# Patient Record
Sex: Male | Born: 1950 | Race: Black or African American | Hispanic: No | Marital: Married | Smoking: Former smoker
Health system: Southern US, Community
[De-identification: ages and names within clinical notes are randomized; demographics above are authoritative.]

## PROBLEM LIST (undated history)

## (undated) DIAGNOSIS — G4730 Sleep apnea, unspecified: Secondary | ICD-10-CM

## (undated) DIAGNOSIS — K219 Gastro-esophageal reflux disease without esophagitis: Secondary | ICD-10-CM

## (undated) DIAGNOSIS — N400 Enlarged prostate without lower urinary tract symptoms: Secondary | ICD-10-CM

## (undated) DIAGNOSIS — Z8679 Personal history of other diseases of the circulatory system: Secondary | ICD-10-CM

## (undated) DIAGNOSIS — I10 Essential (primary) hypertension: Secondary | ICD-10-CM

## (undated) DIAGNOSIS — R531 Weakness: Secondary | ICD-10-CM

## (undated) DIAGNOSIS — Z8659 Personal history of other mental and behavioral disorders: Secondary | ICD-10-CM

## (undated) HISTORY — PX: BRAIN TUMOR EXCISION: SHX577

## (undated) HISTORY — PX: CERVICAL DISCECTOMY: SHX98

## (undated) HISTORY — PX: EYE SURGERY: SHX253

---

## 2014-05-25 DIAGNOSIS — G96198 Other disorders of meninges, not elsewhere classified: Secondary | ICD-10-CM | POA: Insufficient documentation

## 2015-04-18 ENCOUNTER — Emergency Department
Admission: EM | Admit: 2015-04-18 | Discharge: 2015-04-18 | Disposition: A | Discharge status: Home / Self Care | Payer: Medicare Other | Attending: Emergency Medicine | Admitting: Emergency Medicine

## 2015-04-18 ENCOUNTER — Emergency Department: Payer: Medicare Other

## 2015-04-18 ENCOUNTER — Encounter: Payer: Self-pay | Admitting: Emergency Medicine

## 2015-04-18 DIAGNOSIS — K8020 Calculus of gallbladder without cholecystitis without obstruction: Secondary | ICD-10-CM | POA: Insufficient documentation

## 2015-04-18 DIAGNOSIS — I10 Essential (primary) hypertension: Secondary | ICD-10-CM | POA: Diagnosis not present

## 2015-04-18 DIAGNOSIS — Z87891 Personal history of nicotine dependence: Secondary | ICD-10-CM | POA: Diagnosis not present

## 2015-04-18 DIAGNOSIS — R1011 Right upper quadrant pain: Secondary | ICD-10-CM

## 2015-04-18 DIAGNOSIS — K2900 Acute gastritis without bleeding: Secondary | ICD-10-CM | POA: Diagnosis not present

## 2015-04-18 DIAGNOSIS — R0789 Other chest pain: Secondary | ICD-10-CM | POA: Diagnosis present

## 2015-04-18 LAB — CBC
HEMATOCRIT: 41.1 % (ref 40.0–52.0)
Hemoglobin: 12.8 g/dL — ABNORMAL LOW (ref 13.0–18.0)
MCH: 22.9 pg — ABNORMAL LOW (ref 26.0–34.0)
MCHC: 31.1 g/dL — ABNORMAL LOW (ref 32.0–36.0)
MCV: 73.8 fL — AB (ref 80.0–100.0)
PLATELETS: 156 10*3/uL (ref 150–440)
RBC: 5.57 MIL/uL (ref 4.40–5.90)
RDW: 13 % (ref 11.5–14.5)
WBC: 7.4 10*3/uL (ref 3.8–10.6)

## 2015-04-18 LAB — COMPREHENSIVE METABOLIC PANEL
ALK PHOS: 61 U/L (ref 38–126)
ALT: 41 U/L (ref 17–63)
ANION GAP: 7 (ref 5–15)
AST: 76 U/L — ABNORMAL HIGH (ref 15–41)
Albumin: 4.2 g/dL (ref 3.5–5.0)
BILIRUBIN TOTAL: 1.3 mg/dL — AB (ref 0.3–1.2)
BUN: 13 mg/dL (ref 6–20)
CHLORIDE: 103 mmol/L (ref 101–111)
CO2: 27 mmol/L (ref 22–32)
CREATININE: 0.99 mg/dL (ref 0.61–1.24)
Calcium: 8.6 mg/dL — ABNORMAL LOW (ref 8.9–10.3)
GFR calc Af Amer: >60 mL/min (ref >60)
GFR calc non Af Amer: >60 mL/min (ref >60)
Glucose, Bld: 120 mg/dL — ABNORMAL HIGH (ref 65–99)
Potassium: 5.2 mmol/L — ABNORMAL HIGH (ref 3.5–5.1)
SODIUM: 137 mmol/L (ref 135–145)
Total Protein: 7.3 g/dL (ref 6.5–8.1)

## 2015-04-18 LAB — LIPASE, BLOOD: Lipase: 137 U/L — ABNORMAL HIGH (ref 22–51)

## 2015-04-18 LAB — TROPONIN I: Troponin I: <0.03 ng/mL (ref <0.031)

## 2015-04-18 MED ORDER — HYDROCODONE-ACETAMINOPHEN 5-325 MG PO TABS: 1.0000 | ORAL_TABLET | ORAL | Status: DC | PRN

## 2015-04-18 MED ORDER — GI COCKTAIL ~~LOC~~
30.0000 mL | Freq: Once | ORAL | Status: AC
  Administered 2015-04-18: 30 mL via ORAL

## 2015-04-18 MED ORDER — GI COCKTAIL ~~LOC~~
ORAL | Status: AC
  Administered 2015-04-18: 30 mL via ORAL
  Filled 2015-04-18: qty 30

## 2015-04-18 NOTE — ED Notes (Signed)
Pt provided with lunch tray.

## 2015-04-18 NOTE — ED Provider Notes (Signed)
[facility] Emergency Department Provider Note   ____________________________________________  Time seen: 7:30 AM I have reviewed the triage vital signs and the triage nursing note.  HISTORY  Chief Complaint Chest Pain   Historian Patient and his wife  HPI Damon Coffey is a 64 y.o. male whose complaint is low chest/epigastric pain. Started around 5 to 5:30 AM. It felt like gas and he took some Alka-Seltzer. He's had no palpitations and no shortness of breath. No sweating. Mild nausea. No vomiting or diarrhea. No recent illness. No fever.He's not had chest pain like this before. He still has a gall bladder. Pain is mild now, was moderate. No history of cardiac problems.     History reviewed. No pertinent past medical history. reports some hypertension but not on medications for this  There are no active problems to display for this patient.   No past surgical history on file. still has his gallbladder  Current Outpatient Rx  Name  Route  Sig  Dispense  Refill  . HYDROcodone-acetaminophen (NORCO) 5-325 MG per tablet   Oral   Take 1 tablet by mouth every 4 (four) hours as needed for moderate pain.   6 tablet   0     Allergies Contrast media  No family history on file.  Social History History  Substance Use Topics  . Smoking status: Former Research scientist (life sciences)  . Smokeless tobacco: Not on file  . Alcohol Use: 1.2 oz/week    2 Standard drinks or equivalent per week   family history: No coronary artery disease/heart attacks, positive for multiple family members with diabetes  Review of Systems  Constitutional: Negative for fever. Eyes: Negative for visual changes. ENT: Negative for sore throat. Cardiovascular: Negative for palpitations Respiratory: Negative for shortness of breath. Gastrointestinal: Negative for vomiting and diarrhea. Genitourinary: Negative for dysuria. Musculoskeletal: Negative for back pain. Skin: Negative for rash. Neurological:  Negative for headaches, focal weakness or numbness.  ____________________________________________   PHYSICAL EXAM:  VITAL SIGNS: ED Triage Vitals  Enc Vitals Group     BP 04/18/15 0710 118/48 mmHg     Pulse Rate 04/18/15 0710 68     Resp 04/18/15 0710 18     Temp 04/18/15 0710 97.9 F (36.6 C)     Temp Source 04/18/15 0710 Oral     SpO2 04/18/15 0710 98 %     Weight 04/18/15 0710 225 lb (102.059 kg)     Height 04/18/15 0710 5\' 7"  (1.702 m)     Head Cir --      Peak Flow --      Pain Score 04/18/15 0711 6     Pain Loc --      Pain Edu? --      Excl. in [place]? --      Constitutional: Alert and oriented. Well appearing and in no distress. Eyes: Conjunctivae are normal. PERRL. Normal extraocular movements. ENT   Head: Normocephalic and atraumatic.   Nose: No congestion/rhinnorhea.   Mouth/Throat: Mucous membranes are moist.   Neck: No stridor. Cardiovascular: Normal rate, regular rhythm.  No murmurs, rubs, or gallops. Respiratory: Normal respiratory effort without tachypnea nor retractions. Breath sounds are clear and equal bilaterally. No wheezes/rales/rhonchi. Gastrointestinal: Soft with mild tenderness in the epigastrium. No focal tenderness in the right upper quadrant. No lower abdominal tenderness. No guarding no rebound. No distention.  Genitourinary: Musculoskeletal: Nontender with normal range of motion in all extremities. No joint effusions.  No lower extremity tenderness nor edema. Neurologic:  Normal speech and language. No gross focal neurologic deficits are appreciated. Skin:  Skin is warm, dry and intact. No rash noted. Psychiatric: Mood and affect are normal. Speech and behavior are normal. Patient exhibits appropriate insight and judgment.  ____________________________________________   EKG  I, Lisa Roca, MD, the attending physician have personally viewed and interpreted this ECG. 70 bpm. Normal sinus rhythm. Narrow QRS. Normal axis.  Nonspecific T wave. ____________________________________________  LABS (pertinent positives/negatives)  White blood count 7.4, hemoglobin 12.8, potassium 5.2 with hemolysis. Normal BUN and creatinine. AST 76 and bili 1.3 with hemolysis Lipase 137   ____________________________________________  RADIOLOGY Radiologist results reviewed  Chest x-ray: Negative Ultrasound: Gallstones without signs of cholecystitis. __________________________________________  PROCEDURES  Procedure(s) performed: None Critical Care performed: None  ____________________________________________   ED COURSE / ASSESSMENT AND PLAN  Pertinent labs & imaging results that were available during my care of the patient were reviewed by me and considered in my medical decision making (see chart for details).  Patient's symptoms clinically seem more like indigestion/gastritis/abdominal in etiology rather than cardiac. Given the slightly elevated lipase and slightly elevated AST and bili I will go ahead and check a right upper quadrant ultrasound.  I discussed the result of the ultrasound with the patient. Clinically I suspect gastritis/GERD rather than biliary colic, hype ever I did discuss the possibility of biliary colic. He was referred to surgeon if he has ongoing discomfort. I also discussed the atypical chest complaints and recommended follow-up with a cardiologist to consider stress testing.    Patient / Family / Caregiver informed of clinical course, understand medical decision-making process, and agree with plan.    ___________________________________________   FINAL CLINICAL IMPRESSION(S) / ED DIAGNOSES   Final diagnoses:  Right upper quadrant abdominal pain  Acute gastritis without hemorrhage  Gallstones      Lisa Roca, MD 04/20/15 [phone]

## 2015-04-18 NOTE — Discharge Instructions (Signed)
You were evaluated for a lower chest/upper abdominal pain, which I suspect is due to gastritis/GERD. I recommend avoiding fatty, spicy, citrus foods for one week. You may try an acid reducer such as Prilosec 20 mg over-the-counter once a day for one week. He may try over-the-counter Maalox, or Tums, for immediate relief.  Your evaluation was reassuring and I don't think the chest pain was related to a heart blockage, however your EKG shows some minor abnormalities, and I recommend you follow-up with a cardiologist given the atypical nature of the chest discomfort for further evaluation. Call Dr. Tyrell Antonio office for an evaluation appointment this week.  Your ultrasound showed gallstones, but no evidence of obstruction or infection.  You're referred to surgeon for outpatient evaluation to consider a bladder removal. Return to the emergency room for any fever, no worsening abdominal pain, including any vomiting.  Return to the emergency department for any new or worsening chest pain, palpitations, trouble breathing, shortness of breath, abdominal pain, vomiting blood, weakness, dizziness, passing out, or any symptoms concerning to you.  Chest Pain (Nonspecific) It is often hard to give a specific diagnosis for the cause of chest pain. There is always a chance that your pain could be related to something serious, such as a heart attack or a blood clot in the lungs. You need to follow up with your health care provider for further evaluation. CAUSES   Heartburn.  Pneumonia or bronchitis.  Anxiety or stress.  Inflammation around your heart (pericarditis) or lung (pleuritis or pleurisy).  A blood clot in the lung.  A collapsed lung (pneumothorax). It can develop suddenly on its own (spontaneous pneumothorax) or from trauma to the chest.  Shingles infection (herpes zoster virus). The chest wall is composed of bones, muscles, and cartilage. Any of these can be the source of the pain.  The bones can  be bruised by injury.  The muscles or cartilage can be strained by coughing or overwork.  The cartilage can be affected by inflammation and become sore (costochondritis). DIAGNOSIS  Lab tests or other studies may be needed to find the cause of your pain. Your health care provider may have you take a test called an ambulatory electrocardiogram (ECG). An ECG records your heartbeat patterns over a 24-hour period. You may also have other tests, such as:  Transthoracic echocardiogram (TTE). During echocardiography, sound waves are used to evaluate how blood flows through your heart.  Transesophageal echocardiogram (TEE).  Cardiac monitoring. This allows your health care provider to monitor your heart rate and rhythm in real time.  Holter monitor. This is a portable device that records your heartbeat and can help diagnose heart arrhythmias. It allows your health care provider to track your heart activity for several days, if needed.  Stress tests by exercise or by giving medicine that makes the heart beat faster. TREATMENT   Treatment depends on what may be causing your chest pain. Treatment may include:  Acid blockers for heartburn.  Anti-inflammatory medicine.  Pain medicine for inflammatory conditions.  Antibiotics if an infection is present.  You may be advised to change lifestyle habits. This includes stopping smoking and avoiding alcohol, caffeine, and chocolate.  You may be advised to keep your head raised (elevated) when sleeping. This reduces the chance of acid going backward from your stomach into your esophagus. Most of the time, nonspecific chest pain will improve within 2-3 days with rest and mild pain medicine.  HOME CARE INSTRUCTIONS   If antibiotics were prescribed, take  them as directed. Finish them even if you start to feel better.  For the next few days, avoid physical activities that bring on chest pain. Continue physical activities as directed.  Do not use any  tobacco products, including cigarettes, chewing tobacco, or electronic cigarettes.  Avoid drinking alcohol.  Only take medicine as directed by your health care provider.  Follow your health care provider's suggestions for further testing if your chest pain does not go away.  Keep any follow-up appointments you made. If you do not go to an appointment, you could develop lasting (chronic) problems with pain. If there is any problem keeping an appointment, call to reschedule. SEEK MEDICAL CARE IF:   Your chest pain does not go away, even after treatment.  You have a rash with blisters on your chest.  You have a fever. SEEK IMMEDIATE MEDICAL CARE IF:   You have increased chest pain or pain that spreads to your arm, neck, jaw, back, or abdomen.  You have shortness of breath.  You have an increasing cough, or you cough up blood.  You have severe back or abdominal pain.  You feel nauseous or vomit.  You have severe weakness.  You faint.  You have chills. This is an emergency. Do not wait to see if the pain will go away. Get medical help at once. Call your local emergency services (911 in [place] Do not drive yourself to the hospital. MAKE SURE YOU:   Understand these instructions.  Will watch your condition.  Will get help right away if you are not doing well or get worse. Document Released: 08/22/2005 Document Revised: 11/17/2013 Document Reviewed: 06/17/2008 [facility] Patient Information 2015 [place], [place]. This information is not intended to replace advice given to you by your health care provider. Make sure you discuss any questions you have with your health care provider.   Gastritis, Adult Gastritis is soreness and puffiness (inflammation) of the lining of the stomach. If you do not get help, gastritis can cause bleeding and sores (ulcers) in the stomach. HOME CARE   Only take medicine as told by your doctor.  If you were given antibiotic medicines, take them as  told. Finish the medicines even if you start to feel better.  Drink enough fluids to keep your pee (urine) clear or pale yellow.  Avoid foods and drinks that make your problems worse. Foods you may want to avoid include:  Caffeine or alcohol.  Chocolate.  Mint.  Garlic and onions.  Spicy foods.  Citrus fruits, including oranges, lemons, or limes.  Food containing tomatoes, including sauce, chili, salsa, and pizza.  Fried and fatty foods.  Eat small meals throughout the day instead of large meals. GET HELP RIGHT AWAY IF:   You have black or dark red poop (stools).  You throw up (vomit) blood. It may look like coffee grounds.  You cannot keep fluids down.  Your belly (abdominal) pain gets worse.  You have a fever.  You do not feel better after 1 week.  You have any other questions or concerns. MAKE SURE YOU:   Understand these instructions.  Will watch your condition.  Will get help right away if you are not doing well or get worse. Document Released: 04/30/2008 Document Revised: 02/04/2012 Document Reviewed: 12/26/2011 [facility] Patient Information 2015 [place], [place]. This information is not intended to replace advice given to you by your health care provider. Make sure you discuss any questions you have with your health care provider. Gastroesophageal Reflux Disease,  Adult Gastroesophageal reflux disease (GERD) happens when acid from your stomach flows up into the esophagus. When acid comes in contact with the esophagus, the acid causes soreness (inflammation) in the esophagus. Over time, GERD may create small holes (ulcers) in the lining of the esophagus. CAUSES   Increased body weight. This puts pressure on the stomach, making acid rise from the stomach into the esophagus.  Smoking. This increases acid production in the stomach.  Drinking alcohol. This causes decreased pressure in the lower esophageal sphincter (valve or ring of muscle between the esophagus  and stomach), allowing acid from the stomach into the esophagus.  Late evening meals and a full stomach. This increases pressure and acid production in the stomach.  A malformed lower esophageal sphincter. Sometimes, no cause is found. SYMPTOMS   Burning pain in the lower part of the mid-chest behind the breastbone and in the mid-stomach area. This may occur twice a week or more often.  Trouble swallowing.  Sore throat.  Dry cough.  Asthma-like symptoms including chest tightness, shortness of breath, or wheezing. DIAGNOSIS  Your caregiver may be able to diagnose GERD based on your symptoms. In some cases, X-rays and other tests may be done to check for complications or to check the condition of your stomach and esophagus. TREATMENT  Your caregiver may recommend over-the-counter or prescription medicines to help decrease acid production. Ask your caregiver before starting or adding any new medicines.  HOME CARE INSTRUCTIONS   Change the factors that you can control. Ask your caregiver for guidance concerning weight loss, quitting smoking, and alcohol consumption.  Avoid foods and drinks that make your symptoms worse, such as:  Caffeine or alcoholic drinks.  Chocolate.  Peppermint or mint flavorings.  Garlic and onions.  Spicy foods.  Citrus fruits, such as oranges, lemons, or limes.  Tomato-based foods such as sauce, chili, salsa, and pizza.  Fried and fatty foods.  Avoid lying down for the 3 hours prior to your bedtime or prior to taking a nap.  Eat small, frequent meals instead of large meals.  Wear loose-fitting clothing. Do not wear anything tight around your waist that causes pressure on your stomach.  Raise the head of your bed 6 to 8 inches with wood blocks to help you sleep. Extra pillows will not help.  Only take over-the-counter or prescription medicines for pain, discomfort, or fever as directed by your caregiver.  Do not take aspirin, ibuprofen, or  other nonsteroidal anti-inflammatory drugs (NSAIDs). SEEK IMMEDIATE MEDICAL CARE IF:   You have pain in your arms, neck, jaw, teeth, or back.  Your pain increases or changes in intensity or duration.  You develop nausea, vomiting, or sweating (diaphoresis).  You develop shortness of breath, or you faint.  Your vomit is green, yellow, black, or looks like coffee grounds or blood.  Your stool is red, bloody, or black. These symptoms could be signs of other problems, such as heart disease, gastric bleeding, or esophageal bleeding. MAKE SURE YOU:   Understand these instructions.  Will watch your condition.  Will get help right away if you are not doing well or get worse. Document Released: 08/22/2005 Document Revised: 02/04/2012 Document Reviewed: 06/01/2011 [facility] Patient Information 2015 [place], [place]. This information is not intended to replace advice given to you by your health care provider. Make sure you discuss any questions you have with your health care provider.  Cholelithiasis Cholelithiasis (also called gallstones) is a form of gallbladder disease. The gallbladder is a small organ  that helps you digest fats. Symptoms of gallstones are:  Feeling sick to your stomach (nausea).  Throwing up (vomiting).  Belly pain.  Yellowing of the skin (jaundice).  Sudden pain. You may feel the pain for minutes to hours.  Fever.  Pain to the touch. HOME CARE  Only take medicines as told by your doctor.  Eat a low-fat diet until you see your doctor again. Eating fat can result in pain.  Follow up with your doctor as told. Attacks usually happen time after time. Surgery is usually needed for permanent treatment. GET HELP RIGHT AWAY IF:   Your pain gets worse.  Your pain is not helped by medicines.  You have a fever and lasting symptoms for more than 2-3 days.  You have a fever and your symptoms suddenly get worse.  You keep feeling sick to your stomach and throwing  up. MAKE SURE YOU:   Understand these instructions.  Will watch your condition.  Will get help right away if you are not doing well or get worse. Document Released: 04/30/2008 Document Revised: 07/15/2013 Document Reviewed: 05/06/2013 [facility] Patient Information 2015 [place], [place]. This information is not intended to replace advice given to you by your health care provider. Make sure you discuss any questions you have with your health care provider.

## 2015-04-18 NOTE — ED Notes (Signed)
Awoke from sleep, across lower chest

## 2015-04-29 DIAGNOSIS — N62 Hypertrophy of breast: Secondary | ICD-10-CM | POA: Insufficient documentation

## 2015-05-24 ENCOUNTER — Encounter: Payer: Self-pay | Admitting: Emergency Medicine

## 2015-05-24 ENCOUNTER — Other Ambulatory Visit: Payer: Self-pay

## 2015-05-24 ENCOUNTER — Emergency Department
Admission: EM | Admit: 2015-05-24 | Discharge: 2015-05-24 | Discharge status: Against Medical Advice | Payer: Medicare Other | Attending: Emergency Medicine | Admitting: Emergency Medicine

## 2015-05-24 DIAGNOSIS — R109 Unspecified abdominal pain: Secondary | ICD-10-CM | POA: Insufficient documentation

## 2015-05-24 LAB — COMPREHENSIVE METABOLIC PANEL
ALT: 33 U/L (ref 17–63)
AST: 33 U/L (ref 15–41)
Albumin: 4.3 g/dL (ref 3.5–5.0)
Alkaline Phosphatase: 62 U/L (ref 38–126)
Anion gap: 9 (ref 5–15)
BUN: 16 mg/dL (ref 6–20)
CO2: 28 mmol/L (ref 22–32)
Calcium: 9.2 mg/dL (ref 8.9–10.3)
Chloride: 103 mmol/L (ref 101–111)
Creatinine, Ser: 0.97 mg/dL (ref 0.61–1.24)
GFR calc Af Amer: >60 mL/min (ref >60)
GFR calc non Af Amer: >60 mL/min (ref >60)
Glucose, Bld: 119 mg/dL — ABNORMAL HIGH (ref 65–99)
Potassium: 4 mmol/L (ref 3.5–5.1)
Sodium: 140 mmol/L (ref 135–145)
Total Bilirubin: 0.7 mg/dL (ref 0.3–1.2)
Total Protein: 7 g/dL (ref 6.5–8.1)

## 2015-05-24 LAB — CBC WITH DIFFERENTIAL/PLATELET
Basophils Absolute: 0.1 10*3/uL (ref 0–0.1)
Basophils Relative: 1 %
Eosinophils Absolute: 0.2 10*3/uL (ref 0–0.7)
Eosinophils Relative: 3 %
HCT: 39.8 % — ABNORMAL LOW (ref 40.0–52.0)
Hemoglobin: 12.3 g/dL — ABNORMAL LOW (ref 13.0–18.0)
Lymphocytes Relative: 47 %
Lymphs Abs: 2.7 10*3/uL (ref 1.0–3.6)
MCH: 23 pg — ABNORMAL LOW (ref 26.0–34.0)
MCHC: 30.9 g/dL — ABNORMAL LOW (ref 32.0–36.0)
MCV: 74.3 fL — ABNORMAL LOW (ref 80.0–100.0)
Monocytes Absolute: 0.6 10*3/uL (ref 0.2–1.0)
Monocytes Relative: 10 %
Neutro Abs: 2.2 10*3/uL (ref 1.4–6.5)
Neutrophils Relative %: 39 %
Platelets: 147 10*3/uL — ABNORMAL LOW (ref 150–440)
RBC: 5.36 MIL/uL (ref 4.40–5.90)
RDW: 13.1 % (ref 11.5–14.5)
WBC: 5.6 10*3/uL (ref 3.8–10.6)

## 2015-05-24 LAB — URINALYSIS COMPLETE WITH MICROSCOPIC (ARMC ONLY)
Bacteria, UA: NONE SEEN
Bilirubin Urine: NEGATIVE
Glucose, UA: NEGATIVE mg/dL
Hgb urine dipstick: NEGATIVE
Ketones, ur: NEGATIVE mg/dL
Leukocytes, UA: NEGATIVE
Nitrite: NEGATIVE
Protein, ur: NEGATIVE mg/dL
RBC / HPF: NONE SEEN RBC/hpf (ref 0–5)
Specific Gravity, Urine: 1.011 (ref 1.005–1.030)
Squamous Epithelial / LPF: NONE SEEN
WBC, UA: NONE SEEN WBC/hpf (ref 0–5)
pH: 8 (ref 5.0–8.0)

## 2015-05-24 LAB — LIPASE, BLOOD: Lipase: 36 U/L (ref 22–51)

## 2015-05-24 LAB — TROPONIN I: Troponin I: <0.03 ng/mL (ref <0.031)

## 2015-05-24 NOTE — ED Notes (Signed)
Patient ambulatory to triage with steady gait, without difficulty or distress noted; pt reports mid abd pain since 1130pm with no accomp symptoms; st hx of same and dx with gallstones

## 2015-10-17 DIAGNOSIS — G4733 Obstructive sleep apnea (adult) (pediatric): Secondary | ICD-10-CM | POA: Insufficient documentation

## 2016-03-21 DIAGNOSIS — M5417 Radiculopathy, lumbosacral region: Secondary | ICD-10-CM | POA: Insufficient documentation

## 2017-05-28 ENCOUNTER — Other Ambulatory Visit: Payer: Self-pay | Admitting: Neurology

## 2017-05-28 DIAGNOSIS — D4819 Other specified neoplasm of uncertain behavior of connective and other soft tissue: Secondary | ICD-10-CM

## 2017-05-28 DIAGNOSIS — R200 Anesthesia of skin: Secondary | ICD-10-CM

## 2017-05-28 DIAGNOSIS — D481 Neoplasm of uncertain behavior of connective and other soft tissue: Secondary | ICD-10-CM

## 2017-05-31 ENCOUNTER — Ambulatory Visit
Admission: RE | Admit: 2017-05-31 | Discharge: 2017-05-31 | Disposition: A | Discharge status: Home / Self Care | Payer: Medicare Other | Source: Ambulatory Visit | Attending: Neurology | Admitting: Neurology

## 2017-05-31 DIAGNOSIS — Z9889 Other specified postprocedural states: Secondary | ICD-10-CM | POA: Insufficient documentation

## 2017-05-31 DIAGNOSIS — D481 Neoplasm of uncertain behavior of connective and other soft tissue: Secondary | ICD-10-CM | POA: Diagnosis present

## 2017-05-31 DIAGNOSIS — G5621 Lesion of ulnar nerve, right upper limb: Secondary | ICD-10-CM | POA: Insufficient documentation

## 2017-05-31 DIAGNOSIS — R200 Anesthesia of skin: Secondary | ICD-10-CM

## 2017-05-31 DIAGNOSIS — D4819 Other specified neoplasm of uncertain behavior of connective and other soft tissue: Secondary | ICD-10-CM

## 2017-05-31 DIAGNOSIS — M6281 Muscle weakness (generalized): Secondary | ICD-10-CM | POA: Diagnosis present

## 2017-05-31 MED ORDER — GADOBENATE DIMEGLUMINE 529 MG/ML IV SOLN
20.0000 mL | Freq: Once | INTRAVENOUS | Status: AC | PRN
  Administered 2017-05-31: 20 mL via INTRAVENOUS

## 2017-10-09 ENCOUNTER — Other Ambulatory Visit: Payer: Self-pay

## 2017-10-09 ENCOUNTER — Encounter: Payer: Self-pay | Admitting: Emergency Medicine

## 2017-10-09 ENCOUNTER — Emergency Department
Admission: EM | Admit: 2017-10-09 | Discharge: 2017-10-09 | Disposition: A | Discharge status: Home / Self Care | Payer: Medicare Other | Attending: Emergency Medicine | Admitting: Emergency Medicine

## 2017-10-09 DIAGNOSIS — Z87891 Personal history of nicotine dependence: Secondary | ICD-10-CM | POA: Diagnosis not present

## 2017-10-09 DIAGNOSIS — K2970 Gastritis, unspecified, without bleeding: Secondary | ICD-10-CM | POA: Insufficient documentation

## 2017-10-09 DIAGNOSIS — R1013 Epigastric pain: Secondary | ICD-10-CM | POA: Diagnosis present

## 2017-10-09 LAB — CBC
HEMATOCRIT: 42.3 % (ref 40.0–52.0)
Hemoglobin: 13.2 g/dL (ref 13.0–18.0)
MCH: 23.1 pg — ABNORMAL LOW (ref 26.0–34.0)
MCHC: 31.2 g/dL — AB (ref 32.0–36.0)
MCV: 74 fL — AB (ref 80.0–100.0)
PLATELETS: 168 10*3/uL (ref 150–440)
RBC: 5.71 MIL/uL (ref 4.40–5.90)
RDW: 12.9 % (ref 11.5–14.5)
WBC: 11.6 10*3/uL — AB (ref 3.8–10.6)

## 2017-10-09 LAB — URINALYSIS, COMPLETE (UACMP) WITH MICROSCOPIC
Bilirubin Urine: NEGATIVE
Glucose, UA: NEGATIVE mg/dL
Hgb urine dipstick: NEGATIVE
Ketones, ur: NEGATIVE mg/dL
Leukocytes, UA: NEGATIVE
Nitrite: NEGATIVE
PH: 6 (ref 5.0–8.0)
Protein, ur: NEGATIVE mg/dL
SPECIFIC GRAVITY, URINE: 1.002 — AB (ref 1.005–1.030)
Squamous Epithelial / LPF: NONE SEEN

## 2017-10-09 LAB — BASIC METABOLIC PANEL
Anion gap: 8 (ref 5–15)
BUN: 14 mg/dL (ref 6–20)
CHLORIDE: 106 mmol/L (ref 101–111)
CO2: 25 mmol/L (ref 22–32)
Calcium: 9.7 mg/dL (ref 8.9–10.3)
Creatinine, Ser: 0.94 mg/dL (ref 0.61–1.24)
GFR calc Af Amer: >60 mL/min (ref >60)
GLUCOSE: 104 mg/dL — AB (ref 65–99)
POTASSIUM: 3.9 mmol/L (ref 3.5–5.1)
SODIUM: 139 mmol/L (ref 135–145)

## 2017-10-09 LAB — TROPONIN I: Troponin I: <0.03 ng/mL (ref <0.03)

## 2017-10-09 MED ORDER — FAMOTIDINE 40 MG PO TABS: 40.0000 mg | ORAL_TABLET | Freq: Every evening | ORAL | 1 refills | Status: AC

## 2017-10-09 MED ORDER — SUCRALFATE 1 G PO TABS: 1.0000 g | ORAL_TABLET | Freq: Four times a day (QID) | ORAL | 0 refills | Status: AC

## 2017-10-09 NOTE — ED Notes (Signed)
ED Provider at bedside. 

## 2017-10-09 NOTE — ED Notes (Signed)
Pt ambulatory with cane upon discharge. Pt refused wheel chair. Pt and wife verbalized understanding of discharge instruction, prescription and follow-up care. VSS. A&O x4. Skin warm and dry.

## 2017-10-09 NOTE — ED Triage Notes (Signed)
Patient with complaint of intermittent central upper abdominal pain with black stools times three weeks.

## 2017-10-09 NOTE — ED Provider Notes (Signed)
[facility] Emergency Department Provider Note    ____________________________________________   I have reviewed the triage vital signs and the nursing notes.   HISTORY  Chief Complaint Abdominal Pain and Melena   History limited by: Not Limited   HPI Damon Coffey is a 66 y.o. male who presents to the emergency department today because of concern for epigastric pain and dark stools  LOCATION:epigastrum DURATION:3 weeks TIMING: intermittent CONTEXT: patient states that for the past three weeks he has had epigastric pain.  MODIFYING FACTORS: worse with eating ASSOCIATED SYMPTOMS: positive for dark stool. denies any vomiting. Denies fever  Per medical record review patient has a history of previous ED visit with epigastric pain, diagnosed with gastritis.   History reviewed. No pertinent past medical history.  There are no active problems to display for this patient.   Past Surgical History:  Procedure Laterality Date  . BRAIN TUMOR EXCISION    . CERVICAL DISCECTOMY      Prior to Admission medications   Medication Sig Start Date End Date Taking? Authorizing Provider  HYDROcodone-acetaminophen (NORCO) 5-325 MG per tablet Take 1 tablet by mouth every 4 (four) hours as needed for moderate pain. 04/18/15   Lisa Roca, MD    Allergies Contrast media [iodinated diagnostic agents]  No family history on file.  Social History Social History   Tobacco Use  . Smoking status: Former Research scientist (life sciences)  . Smokeless tobacco: Never Used  Substance Use Topics  . Alcohol use: Yes    Alcohol/week: 1.2 oz    Types: 2 Standard drinks or equivalent per week  . Drug use: Not on file    Review of Systems Constitutional: No fever/chills Eyes: No visual changes. ENT: No sore throat. Cardiovascular: Denies chest pain. Respiratory: Denies shortness of breath. Gastrointestinal: Positive for epigastric pain Genitourinary: Negative for dysuria. Musculoskeletal:  Negative for back pain. Skin: Negative for rash. Neurological: Negative for headaches, focal weakness or numbness.  ____________________________________________   PHYSICAL EXAM:  VITAL SIGNS: ED Triage Vitals [10/09/17 1917]  Enc Vitals Group     BP (!) 154/81     Pulse Rate 62     Resp 18     Temp 98.7 F (37.1 C)     Temp Source Oral     SpO2 98 %     Weight 210 lb (95.3 kg)     Height 5\' 7"  (1.702 m)     Head Circumference      Peak Flow      Pain Score 6   Constitutional: Alert and oriented. Well appearing and in no distress. Eyes: Conjunctivae are normal.  ENT   Head: Normocephalic and atraumatic.   Nose: No congestion/rhinnorhea.   Mouth/Throat: Mucous membranes are moist.   Neck: No stridor. Hematological/Lymphatic/Immunilogical: No cervical lymphadenopathy. Cardiovascular: Normal rate, regular rhythm.  No murmurs, rubs, or gallops.  Respiratory: Normal respiratory effort without tachypnea nor retractions. Breath sounds are clear and equal bilaterally. No wheezes/rales/rhonchi. Gastrointestinal: Soft and non tender. No rebound. No guarding.  Genitourinary: Deferred Musculoskeletal: Normal range of motion in all extremities. No lower extremity edema. Neurologic:  Normal speech and language. No gross focal neurologic deficits are appreciated.  Skin:  Skin is warm, dry and intact. No rash noted. Psychiatric: Mood and affect are normal. Speech and behavior are normal. Patient exhibits appropriate insight and judgment.  ____________________________________________    LABS (pertinent positives/negatives)  Trop <0.03 CBC wbc 11.6, hgb 13.2 BMP glu 104 otherwise wnl UA not consistent with infection,  no blood  ____________________________________________   EKG  I, Nance Pear, attending physician, personally viewed and interpreted this EKG  EKG Time: 1926 Rate: 65 Rhythm: sinus rhythm 1st degree av block Axis: normal Intervals: qtc  395 QRS: narrow ST changes: no st elevation Impression: abnormal ekg   ____________________________________________    RADIOLOGY  None  ____________________________________________   PROCEDURES  Procedures  ____________________________________________   INITIAL IMPRESSION / ASSESSMENT AND PLAN / ED COURSE  Pertinent labs & imaging results that were available during my care of the patient were reviewed by me and considered in my medical decision making (see chart for details).  Differential diagnosis includes, but is not limited to, biliary disease (biliary colic, acute cholecystitis, cholangitis, choledocholithiasis, etc), intrathoracic causes for epigastric abdominal pain including ACS, gastritis, duodenitis, pancreatitis, small bowel or large bowel obstruction, abdominal aortic aneurysm, hernia, and gastritis.  Clinical story and work up most consistent with gastritis. Given length of symptoms and lack of anemia or abnormal vital signs do not think significant bleed at this time. Will start on antacids and sucralfate. Will give diet information. Discussed plan with patient and return precautions.  ____________________________________________   FINAL CLINICAL IMPRESSION(S) / ED DIAGNOSES  Final diagnoses:  Gastritis, presence of bleeding unspecified, unspecified chronicity, unspecified gastritis type     Note: This dictation was prepared with Dragon dictation. Any transcriptional errors that result from this process are unintentional     Nance Pear, MD 10/09/17 2344

## 2017-10-09 NOTE — Discharge Instructions (Signed)
Please seek medical attention for any high fevers, chest pain, shortness of breath, change in behavior, persistent vomiting, bloody stool or any other new or concerning symptoms.  

## 2017-10-22 ENCOUNTER — Ambulatory Visit: Payer: Medicare Other | Admitting: Gastroenterology

## 2018-01-29 ENCOUNTER — Other Ambulatory Visit: Payer: Self-pay | Admitting: Neurology

## 2018-01-29 DIAGNOSIS — R42 Dizziness and giddiness: Secondary | ICD-10-CM

## 2018-02-07 ENCOUNTER — Ambulatory Visit
Admission: RE | Admit: 2018-02-07 | Discharge: 2018-02-07 | Disposition: A | Discharge status: Home / Self Care | Payer: Medicare Other | Source: Ambulatory Visit | Attending: Neurology | Admitting: Neurology

## 2018-02-07 DIAGNOSIS — Z9889 Other specified postprocedural states: Secondary | ICD-10-CM | POA: Diagnosis not present

## 2018-02-07 DIAGNOSIS — G3189 Other specified degenerative diseases of nervous system: Secondary | ICD-10-CM | POA: Insufficient documentation

## 2018-02-07 DIAGNOSIS — R42 Dizziness and giddiness: Secondary | ICD-10-CM

## 2018-02-12 DIAGNOSIS — G5601 Carpal tunnel syndrome, right upper limb: Secondary | ICD-10-CM | POA: Insufficient documentation

## 2018-02-12 DIAGNOSIS — G5621 Lesion of ulnar nerve, right upper limb: Secondary | ICD-10-CM | POA: Insufficient documentation

## 2018-06-17 ENCOUNTER — Other Ambulatory Visit: Payer: Self-pay

## 2018-06-17 ENCOUNTER — Ambulatory Visit: Payer: Medicare Other | Attending: Sports Medicine | Admitting: Occupational Therapy

## 2018-06-17 DIAGNOSIS — G5621 Lesion of ulnar nerve, right upper limb: Secondary | ICD-10-CM | POA: Diagnosis present

## 2018-06-17 NOTE — Therapy (Signed)
[facility] [address] [place], [address] Phone: [phone]   Fax:  [phone]  Occupational Therapy Evaluation  Patient Details  Name: Damon Coffey MRN: [id number] Date of Birth: 1950/12/02 Referring Provider: Candelaria Stagers   Encounter Date: 06/17/2018  OT End of Session - 06/17/18 1608    Visit Number  1    Number of Visits  6    Date for OT Re-Evaluation  07/29/18    OT Start Time  1340    OT Stop Time  1443    OT Time Calculation (min)  63 min    Activity Tolerance  Patient tolerated treatment well    Behavior During Therapy  [facility] for tasks assessed/performed       No past medical history on file.  Past Surgical History:  Procedure Laterality Date  . BRAIN TUMOR EXCISION    . CERVICAL DISCECTOMY      There were no vitals filed for this visit.  Subjective Assessment - 06/17/18 1600    Subjective   I had for about year the numbness, sensation changes in my arm into my pinkie, ring and some middle finger - as well as elbow pain - but then also at my R upper back on my shoulder blade -  I cannot get comfortable at night time - I cannot use my hand to  hold or pick up things because of my neck  and forearm surgery in 2014 - so I pick up with my palms and forearms     Patient Stated Goals  Just want the pain and sensation issues better - so I can sleep and use my R arm without pain or discomfort     Currently in Pain?  No/denies        [facility] OT Assessment - 06/17/18 0001      Assessment   Medical Diagnosis  R ulnar neuropathy , R rhomboid pain    Referring Provider  Kubinski    Onset Date/Surgical Date  04/26/17    Hand Dominance  Right      Home  Environment   Lives With  Spouse      Prior Function   Vocation  On disability    Leisure  Jehova withness, go out and  visit people , watch tv ,       Strength   Right Hand Grip (lbs)  35    Left Hand Grip (lbs)  80          Pt to do  contrast on R elbow   2 x day  And Ulnar N glide   5 reps   2 x day  Elbow pad to wear at night time anterior elbow  And daytime over cubital tunnel to decrease compression   Recommend Xray to cervical  And PT for R upper back , scapula , weakness - trigger points to upper traps and periscapular muscle              OT Education - 06/17/18 1608    Education Details  Findings of OT eval ,POC , PT referral and Homeprogram     Person(s) Educated  Patient    Methods  Explanation;Demonstration;Handout    Comprehension  Verbalized understanding;Returned demonstration          OT Long Term Goals - 06/17/18 1618      OT LONG TERM GOAL #1   Title  Pt to be ind in HEP for  decrease syptoms at cubital tunnel - including tenderness and Tinel     Baseline  positive Tinel and tenderness at Cubital tunnel     Time  6    Status  New    Target Date  07/29/18            Plan - 06/17/18 1609    Clinical Impression Statement  Pt present at OT eval with extended history of complex surgical intervention of cervical spine , ulnar and medial nerve - pt test positive for Tinel at R cubital tunnel , and tenderness - pt has ulnar  nerve deformity to hand since cervical surgery in 2014 - pt now with new onset of parasthesis  ulnar nerve since summer 2018 - but pt also show R sided cervical radicular symptoms  with palpation of trigger points in upper traps and periscapular muscles  with muscle weakness- pt was provided with HEP for neuropathy at elbow - but would recommend referral to PT for R cervical , scapula and shoulder - but prior to that evaluation  would ask for Cervical xray please     Occupational performance deficits (Please refer to evaluation for details):  ADL's;Rest and Sleep;Play;Leisure;IADL's    Rehab Potential  Fair    Current Impairments/barriers affecting progress:  extended surgical history to cervical and ulnar - medial nerve on R arm     OT Frequency  1x / week    OT  Duration  6 weeks    OT Treatment/Interventions  Self-care/ADL training;Therapeutic exercise;Patient/family education;Splinting;Ultrasound;Contrast Bath;Iontophoresis;Manual Therapy    Plan  referral to PT for cervical radicular symptoms and triggerpoints scapula - and follow up on pt after doing HEP for week for cubital tunnel     Clinical Decision Making  Several treatment options, min-mod task modification necessary    OT Home Exercise Plan  see pt instruction     Consulted and Agree with Plan of Care  Patient       Patient will benefit from skilled therapeutic intervention in order to improve the following deficits and impairments:  Pain, Impaired sensation, Decreased strength, Increased muscle spasms, Impaired UE functional use, Decreased range of motion  Visit Diagnosis: Ulnar neuropathy of right upper extremity - Plan: Ot plan of care cert/re-cert    Problem List There are no active problems to display for this patient.   Rosalyn Gess OTR/L,CLT 06/17/2018, 4:23 PM  [facility] [address] [place], [address] Phone: [phone]   Fax:  [phone]  Name: Damon Coffey MRN: [id number] Date of Birth: 02/24/1951

## 2018-06-17 NOTE — Patient Instructions (Signed)
Pt to do contrast on R elbow   2 x day  And Ulnar N glide   5 reps   2 x day  Elbow pad to wear at night time anterior elbow  And daytime over cubital tunnel to decrease compression   Recommend Xray to cervical  And PT for R upper back , scapula , weakness - trigger points to upper traps and periscapular muscle

## 2018-06-27 ENCOUNTER — Ambulatory Visit: Payer: Medicare Other | Admitting: Occupational Therapy

## 2018-07-03 ENCOUNTER — Ambulatory Visit: Payer: Medicare Other | Admitting: Occupational Therapy

## 2018-07-03 ENCOUNTER — Ambulatory Visit: Payer: Medicare Other | Admitting: Physical Therapy

## 2018-07-07 ENCOUNTER — Ambulatory Visit: Payer: Medicare Other | Admitting: Occupational Therapy

## 2018-07-07 ENCOUNTER — Ambulatory Visit: Payer: Medicare Other | Admitting: Physical Therapy

## 2018-07-10 ENCOUNTER — Ambulatory Visit: Payer: Medicare Other | Admitting: Physical Therapy

## 2018-07-10 ENCOUNTER — Ambulatory Visit: Payer: Medicare Other | Admitting: Occupational Therapy

## 2018-07-14 ENCOUNTER — Ambulatory Visit: Payer: Medicare Other | Admitting: Physical Therapy

## 2018-07-14 ENCOUNTER — Ambulatory Visit: Payer: Medicare Other | Admitting: Occupational Therapy

## 2019-09-03 ENCOUNTER — Emergency Department: Payer: Medicare Other

## 2019-09-03 ENCOUNTER — Emergency Department
Admission: EM | Admit: 2019-09-03 | Discharge: 2019-09-03 | Disposition: A | Discharge status: Home / Self Care | Payer: Medicare Other | Attending: Emergency Medicine | Admitting: Emergency Medicine

## 2019-09-03 ENCOUNTER — Other Ambulatory Visit: Payer: Self-pay

## 2019-09-03 DIAGNOSIS — Z87891 Personal history of nicotine dependence: Secondary | ICD-10-CM | POA: Insufficient documentation

## 2019-09-03 DIAGNOSIS — I10 Essential (primary) hypertension: Secondary | ICD-10-CM | POA: Diagnosis not present

## 2019-09-03 DIAGNOSIS — H539 Unspecified visual disturbance: Secondary | ICD-10-CM

## 2019-09-03 DIAGNOSIS — H538 Other visual disturbances: Secondary | ICD-10-CM | POA: Insufficient documentation

## 2019-09-03 HISTORY — DX: Essential (primary) hypertension: I10

## 2019-09-03 HISTORY — DX: Benign prostatic hyperplasia without lower urinary tract symptoms: N40.0

## 2019-09-03 LAB — CBC
HCT: 38.6 % — ABNORMAL LOW (ref 39.0–52.0)
Hemoglobin: 11.7 g/dL — ABNORMAL LOW (ref 13.0–17.0)
MCH: 22.9 pg — ABNORMAL LOW (ref 26.0–34.0)
MCHC: 30.3 g/dL (ref 30.0–36.0)
MCV: 75.4 fL — ABNORMAL LOW (ref 80.0–100.0)
Platelets: 190 10*3/uL (ref 150–400)
RBC: 5.12 MIL/uL (ref 4.22–5.81)
RDW: 12.5 % (ref 11.5–15.5)
WBC: 7 10*3/uL (ref 4.0–10.5)
nRBC: 0 % (ref 0.0–0.2)

## 2019-09-03 LAB — BASIC METABOLIC PANEL
Anion gap: 6 (ref 5–15)
BUN: 14 mg/dL (ref 8–23)
CO2: 28 mmol/L (ref 22–32)
Calcium: 9 mg/dL (ref 8.9–10.3)
Chloride: 105 mmol/L (ref 98–111)
Creatinine, Ser: 0.9 mg/dL (ref 0.61–1.24)
GFR calc Af Amer: >60 mL/min (ref >60)
GFR calc non Af Amer: >60 mL/min (ref >60)
Glucose, Bld: 100 mg/dL — ABNORMAL HIGH (ref 70–99)
Potassium: 4.1 mmol/L (ref 3.5–5.1)
Sodium: 139 mmol/L (ref 135–145)

## 2019-09-03 LAB — GLUCOSE, CAPILLARY: Glucose-Capillary: 94 mg/dL (ref 70–99)

## 2019-09-03 NOTE — ED Triage Notes (Signed)
Brought from [facility] for blurry vision right eye. He woke up with it this am.  History of brain tumor.

## 2019-09-03 NOTE — ED Triage Notes (Signed)
Pt reports onset of blurry vision upon awakening this morning. Unable to specify a last known well time as he states that he only noticed it was blurry when he went to read something at breakfast. Pt states he is able to see, but right eye is fuzzy. Denies headache, slurred speech, confusion, weakness, or balance problems. Pt wears glasses at baseline. Pt alert and oriented X4, cooperative, RR even and unlabored, color WNL. Pt in NAD.

## 2019-09-03 NOTE — ED Notes (Signed)
Pt asking for snack. Given graham crackers and peanut butter and ice water. Ok per Dr. Jimmye Norman.

## 2019-09-03 NOTE — ED Notes (Addendum)
Pt reports blurred vision in his right eye that started this morning. Pt states he went to [facility] clinic and was sent to ER. Pt wears glasses and is wearing glasses at this time.  Denies headache, or hx HTN. NAD at this time. Speech clear at this time, no weakness to extremities, ambulatory.

## 2019-09-03 NOTE — ED Provider Notes (Signed)
[facility] Emergency Department Provider Note       Time seen: ----------------------------------------- 2:38 PM on 09/03/2019 -----------------------------------------   I have reviewed the triage vital signs and the nursing notes.  HISTORY   Chief Complaint Blurred Vision   HPI Damon Coffey is a 68 y.o. male with a history of enlarged prostate, hypertension, brain tumor excision, cervical discectomy who presents to the ED for blurry vision noted upon awakening this morning.  Patient was unable to specify last known well time.  Patient states he is able to see but his right eye is fuzzy.  He denies headache, slurred speech, confusion, weakness or balance problems.  He wears glasses at baseline.  Past Medical History:  Diagnosis Date  . Enlarged prostate   . Hypertension     There are no active problems to display for this patient.   Past Surgical History:  Procedure Laterality Date  . BRAIN TUMOR EXCISION    . CERVICAL DISCECTOMY      Allergies Contrast media [iodinated diagnostic agents]  Social History Social History   Tobacco Use  . Smoking status: Former Research scientist (life sciences)  . Smokeless tobacco: Never Used  Substance Use Topics  . Alcohol use: Yes    Alcohol/week: 2.0 standard drinks    Types: 2 Standard drinks or equivalent per week  . Drug use: Not on file   Review of Systems Constitutional: Negative for fever. HEENT: Positive for vision changes Cardiovascular: Negative for chest pain. Respiratory: Negative for shortness of breath. Gastrointestinal: Negative for abdominal pain, vomiting and diarrhea. Musculoskeletal: Negative for back pain. Skin: Negative for rash. Neurological: Negative for headaches, focal weakness or numbness.  All systems negative/normal/unremarkable except as stated in the HPI  ____________________________________________   PHYSICAL EXAM:  VITAL SIGNS: ED Triage Vitals  Enc Vitals Group     BP 09/03/19  1209 123/70     Pulse Rate 09/03/19 1209 67     Resp 09/03/19 1209 16     Temp 09/03/19 1213 98 F (36.7 C)     Temp Source 09/03/19 1209 Oral     SpO2 09/03/19 1209 97 %     Weight 09/03/19 1213 210 lb (95.3 kg)     Height 09/03/19 1213 5\' 7"  (1.702 m)     Head Circumference --      Peak Flow --      Pain Score 09/03/19 1213 0     Pain Loc --      Pain Edu? --      Excl. in [place]? --    Constitutional: Alert and oriented. Well appearing and in no distress. Eyes: Conjunctivae are normal. Normal extraocular movements.  No erythema is noted, normal pupillary function ENT      Head: Normocephalic and atraumatic.  Previous occipital craniotomy scars noted      Nose: No congestion/rhinnorhea.      Mouth/Throat: Mucous membranes are moist.      Neck: No stridor. Cardiovascular: Normal rate, regular rhythm. No murmurs, rubs, or gallops. Respiratory: Normal respiratory effort without tachypnea nor retractions. Breath sounds are clear and equal bilaterally. No wheezes/rales/rhonchi. Musculoskeletal: Nontender with normal range of motion in extremities. No lower extremity tenderness nor edema. Neurologic:  Normal speech and language. No gross focal neurologic deficits are appreciated.  Skin:  Skin is warm, dry and intact. No rash noted. Psychiatric: Mood and affect are normal. Speech and behavior are normal.  ____________________________________________  ED COURSE:  As part of my medical decision making, I reviewed  the following data within the [place] History obtained from family if available, nursing notes, old chart and ekg, as well as notes from prior ED visits. Patient presented for vision changes, we will assess with labs and imaging as indicated at this time.   Procedures  Damon Coffey was evaluated in Emergency Department on 09/03/2019 for the symptoms described in the history of present illness. He was evaluated in the context of the global COVID-19 pandemic,  which necessitated consideration that the patient might be at risk for infection with the SARS-CoV-2 virus that causes COVID-19. Institutional protocols and algorithms that pertain to the evaluation of patients at risk for COVID-19 are in a state of rapid change based on information released by regulatory bodies including the CDC and federal and state organizations. These policies and algorithms were followed during the patient's care in the ED.  ____________________________________________   LABS (pertinent positives/negatives)  Labs Reviewed  CBC - Abnormal; Notable for the following components:      Result Value   Hemoglobin 11.7 (*)    HCT 38.6 (*)    MCV 75.4 (*)    MCH 22.9 (*)    All other components within normal limits  BASIC METABOLIC PANEL - Abnormal; Notable for the following components:   Glucose, Bld 100 (*)    All other components within normal limits  GLUCOSE, CAPILLARY  CBG MONITORING, ED    RADIOLOGY Images were viewed by me  CT head  IMPRESSION:  1. No acute intracranial pathology.   2. 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  ____________________________________________   DIFFERENTIAL DIAGNOSIS   Retinal detachment, cataract formation, brain tumor, hyperglycemia  FINAL ASSESSMENT AND PLAN  Vision changes   Plan: The patient had presented for vision changes in the right eye. Patient's labs were reassuring. Patient's imaging did not reveal any acute process.  I have discussed with ophthalmology who will see him tomorrow morning for reevaluation.   Laurence Aly, MD    Note: This note was generated in part or whole with voice recognition software. Voice recognition is usually quite accurate but there are transcription errors that can and very often do occur. I apologize for any typographical errors that were not detected and corrected.     Earleen Newport, MD 09/03/19 [phone]

## 2019-09-03 NOTE — ED Notes (Signed)
Patient in wheelchair in lobby in nad.  Updated on wait time.

## 2019-10-12 ENCOUNTER — Other Ambulatory Visit: Payer: Self-pay

## 2019-10-12 DIAGNOSIS — Z20822 Contact with and (suspected) exposure to covid-19: Secondary | ICD-10-CM

## 2019-10-14 LAB — NOVEL CORONAVIRUS, NAA: SARS-CoV-2, NAA: NOT DETECTED

## 2020-03-23 DIAGNOSIS — I10 Essential (primary) hypertension: Secondary | ICD-10-CM | POA: Insufficient documentation

## 2020-04-26 IMAGING — CT CT HEAD W/O CM
3 series · 15 of 47 positions shown, 18 images · non-contrast
Comparison: 02/07/2018, MR brain, 05/31/2017

CLINICAL DATA: Stroke suspected, vision changes, history of brain
tumor

EXAM:
CT HEAD WITHOUT CONTRAST
TECHNIQUE: Contiguous axial images were obtained from the base of the skull
through the vertex without intravenous contrast.

[Series 2: head wo · axial · 0.43mm/px · z∈[+319,+444]mm · 9 of 30 slices shown, 12 images]
[im 3/30  brain]
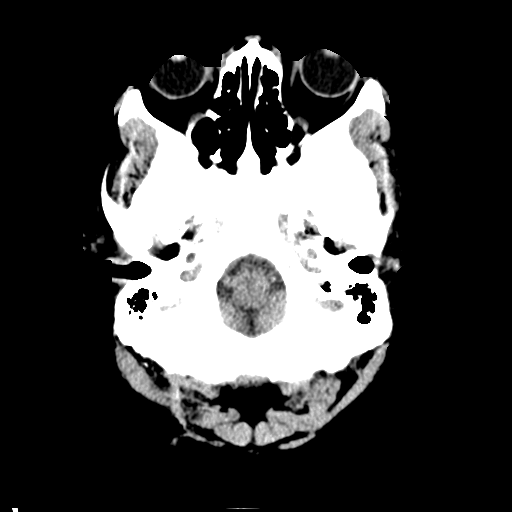
[im 3/30  bone]
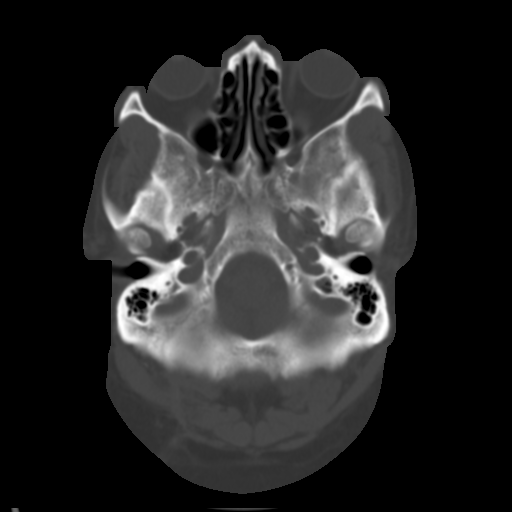
[im 6/30  brain]
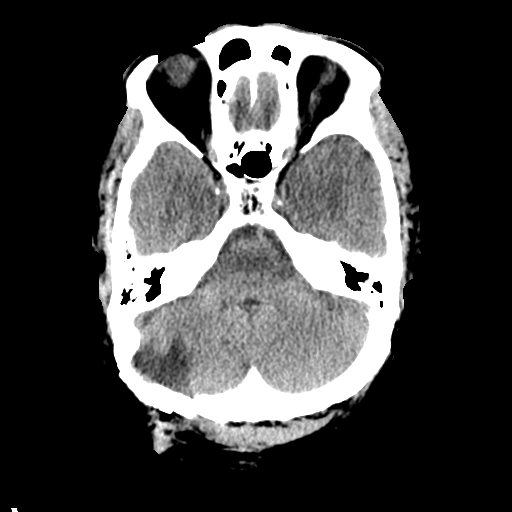
[im 9/30  brain]
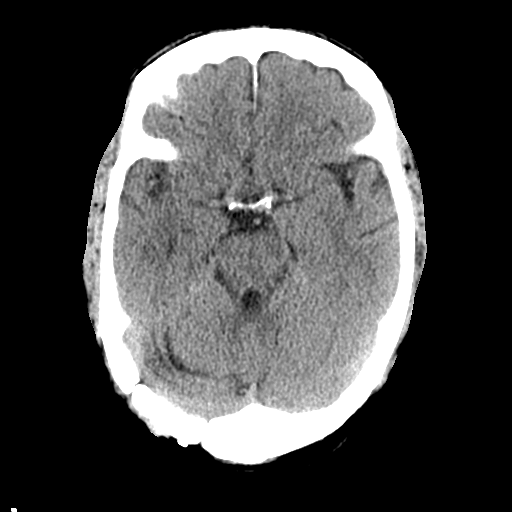
[im 12/30  brain]
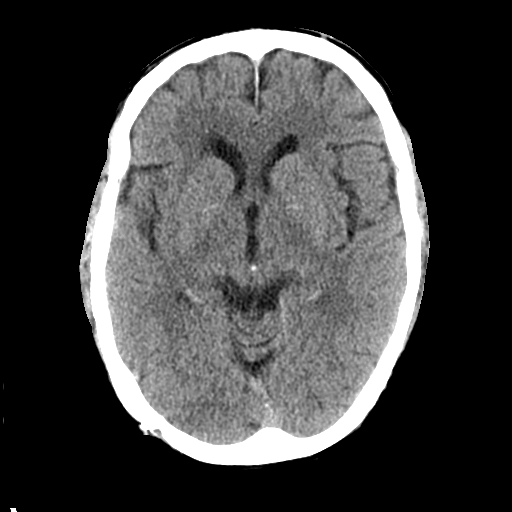
[im 16/30  brain]
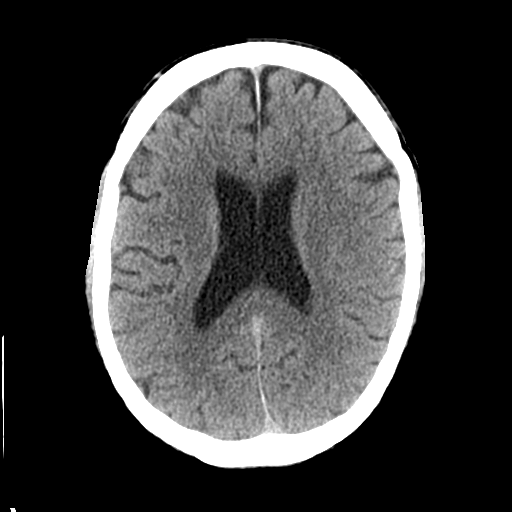
[im 16/30  bone]
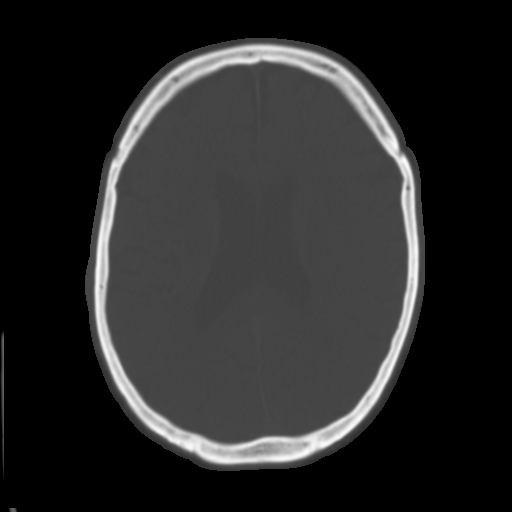
[im 19/30  brain]
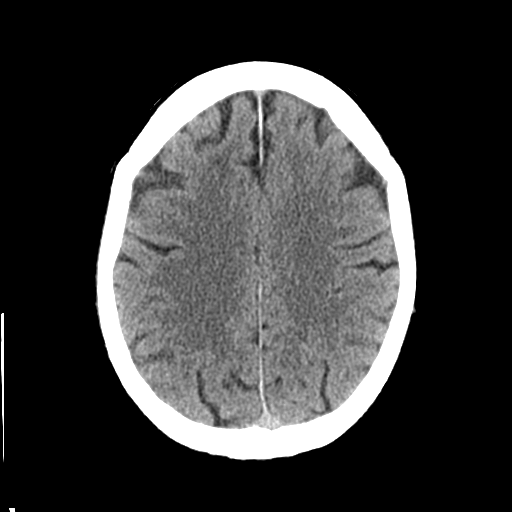
[im 22/30  brain]
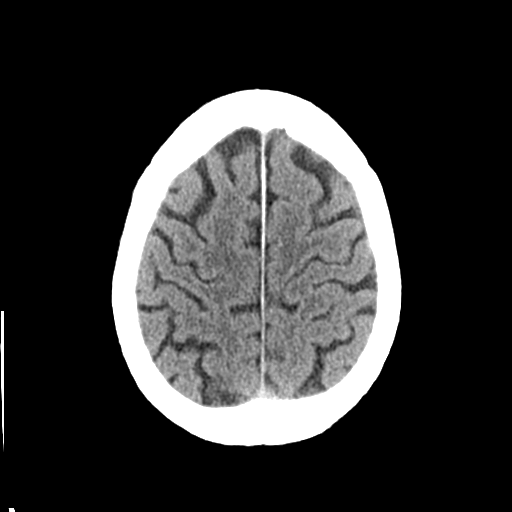
[im 25/30  brain]
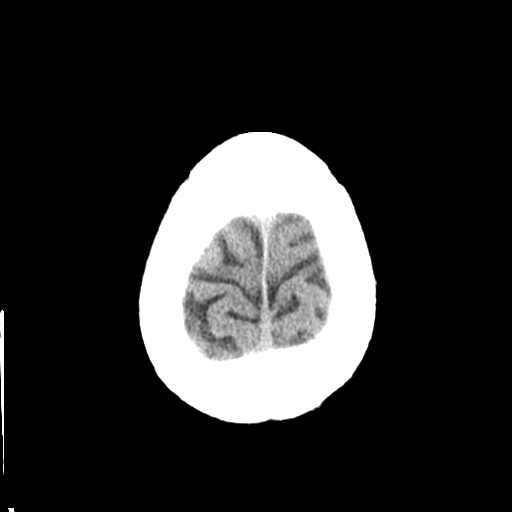
[im 28/30  brain]
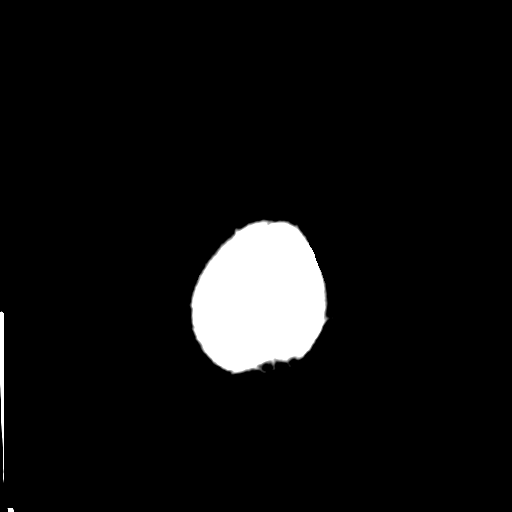
[im 28/30  bone]
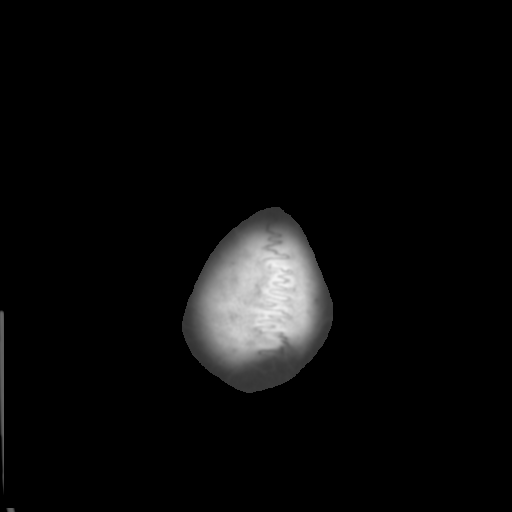

[Series 4: coronal soft tissue · coronal · 0.31mm/px · 3 of 66 slices shown]
[im 22/66  brain]
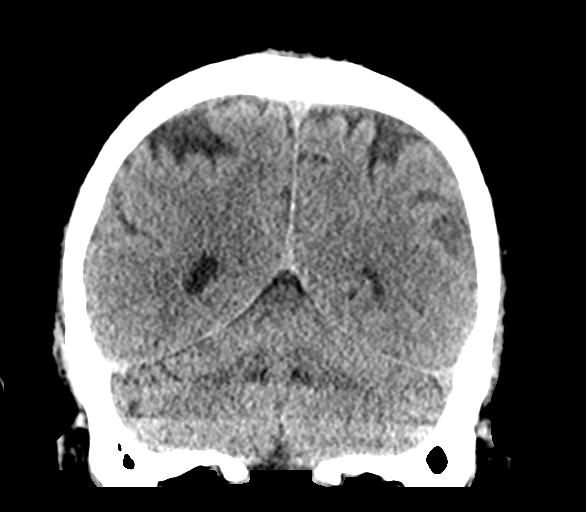
[im 29/66  brain]
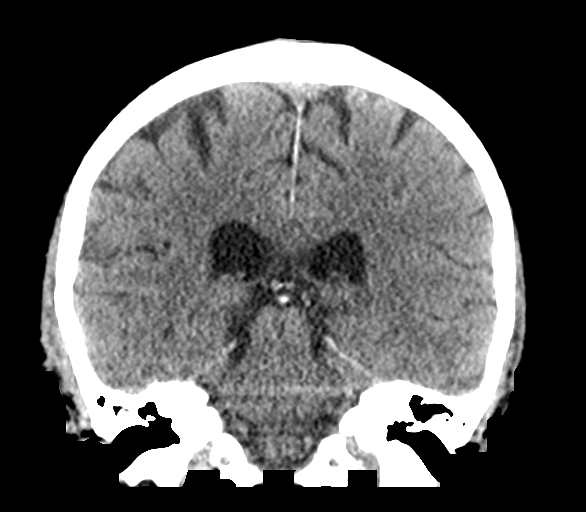
[im 37/66  brain]
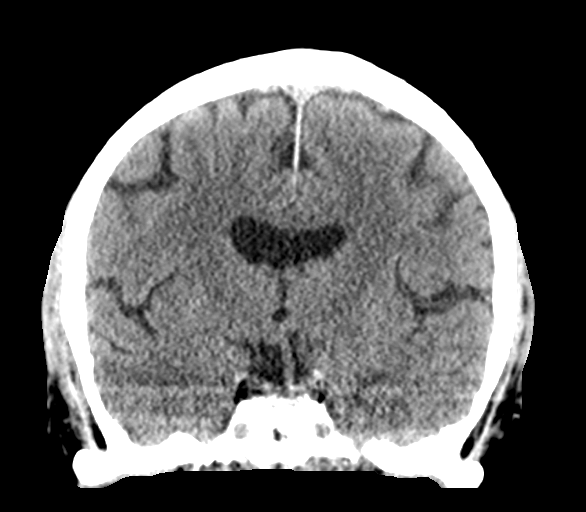

[Series 5: sagittal soft tissue · sagittal · 0.33mm/px · 3 of 54 slices shown]
[im 18/54  brain]
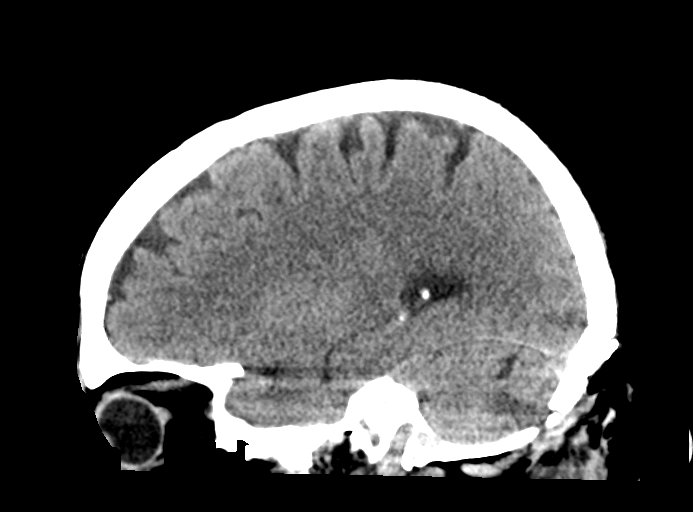
[im 27/54  brain]
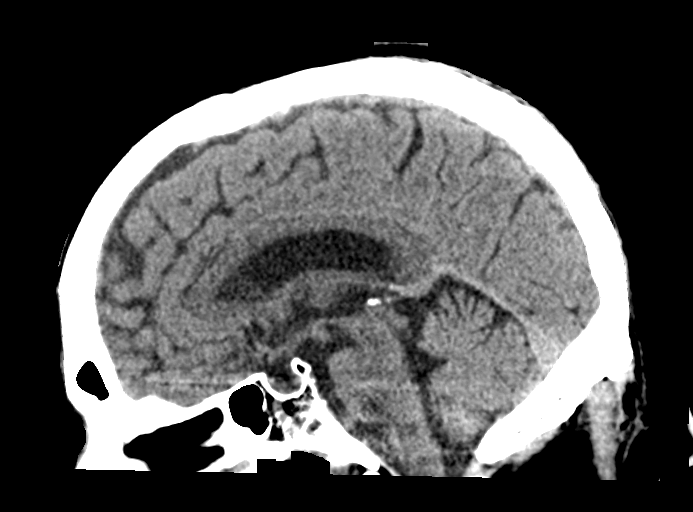
[im 36/54  brain]
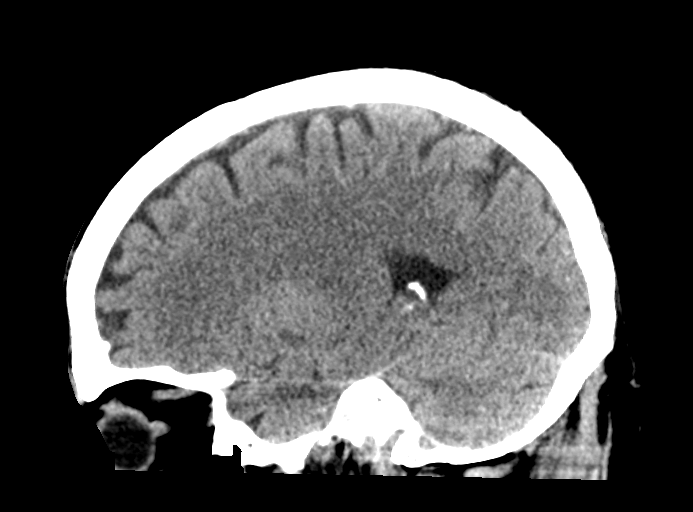

[15 of 47 positions shown; findings below may reference images not displayed]

FINDINGS: Brain: No evidence of acute infarction, hemorrhage, hydrocephalus,
extra-axial collection or mass lesion/mass effect. Encephalomalacia
of the right cerebellar hemisphere in keeping with prior resection.

Vascular: No hyperdense vessel or unexpected calcification.

Skull: Redemonstrated postoperative findings of right occipital
craniotomy. Negative for fracture or focal lesion.

Sinuses/Orbits: No acute finding.

Other: None.
IMPRESSION: 1.  No acute intracranial pathology.

2. Redemonstrated postoperative findings of right cerebellar mass
resection.

## 2020-06-16 DIAGNOSIS — H43391 Other vitreous opacities, right eye: Secondary | ICD-10-CM | POA: Insufficient documentation

## 2020-06-16 DIAGNOSIS — H35033 Hypertensive retinopathy, bilateral: Secondary | ICD-10-CM | POA: Insufficient documentation

## 2020-06-16 DIAGNOSIS — H35371 Puckering of macula, right eye: Secondary | ICD-10-CM | POA: Insufficient documentation

## 2020-06-16 DIAGNOSIS — H34831 Tributary (branch) retinal vein occlusion, right eye, with macular edema: Secondary | ICD-10-CM | POA: Insufficient documentation

## 2022-03-08 ENCOUNTER — Ambulatory Visit: Payer: Medicare Other

## 2022-07-17 ENCOUNTER — Emergency Department
Admission: EM | Admit: 2022-07-17 | Discharge: 2022-07-17 | Disposition: A | Discharge status: Home / Self Care | Payer: Medicare Other | Attending: Emergency Medicine | Admitting: Emergency Medicine

## 2022-07-17 ENCOUNTER — Other Ambulatory Visit: Payer: Self-pay

## 2022-07-17 ENCOUNTER — Encounter: Payer: Self-pay | Admitting: Emergency Medicine

## 2022-07-17 ENCOUNTER — Emergency Department: Payer: Medicare Other

## 2022-07-17 DIAGNOSIS — W06XXXA Fall from bed, initial encounter: Secondary | ICD-10-CM | POA: Insufficient documentation

## 2022-07-17 DIAGNOSIS — Y92003 Bedroom of unspecified non-institutional (private) residence as the place of occurrence of the external cause: Secondary | ICD-10-CM | POA: Diagnosis not present

## 2022-07-17 DIAGNOSIS — G44319 Acute post-traumatic headache, not intractable: Secondary | ICD-10-CM | POA: Insufficient documentation

## 2022-07-17 DIAGNOSIS — S40012A Contusion of left shoulder, initial encounter: Secondary | ICD-10-CM | POA: Diagnosis not present

## 2022-07-17 DIAGNOSIS — M25512 Pain in left shoulder: Secondary | ICD-10-CM | POA: Diagnosis present

## 2022-07-17 DIAGNOSIS — W19XXXA Unspecified fall, initial encounter: Secondary | ICD-10-CM

## 2022-07-17 MED ORDER — METHOCARBAMOL 500 MG PO TABS: 500.0000 mg | ORAL_TABLET | Freq: Four times a day (QID) | ORAL | 0 refills | Status: AC

## 2022-07-17 NOTE — ED Triage Notes (Signed)
Patient to ED for a fall. Patient states he fell out of the bed on the hardwood floor. Patient states he hit his head, neck, and left shoulder. Patient states he has been having some dizzy spells since. Denies LOC or blood thinners.

## 2022-07-17 NOTE — ED Provider Triage Note (Signed)
  Emergency Medicine Provider Triage Evaluation Note  Damon Coffey , a 71 y.o.male,  was evaluated in triage.  Pt complains of injuries from fall.  Patient states that he accidentally rolled out of bed onto hardwood floor.  He states he has been having headache, neck pain, and left shoulder pain ever since.  Additionally has been endorsing some dizzy spells.   Review of Systems  Positive: Headache, neck pain, left shoulder pain Negative: Denies fever, chest pain, vomiting  Physical Exam   Vitals:   07/17/22 1814  BP: (!) 162/82  Pulse: 60  Resp: 18  Temp: 98.2 F (36.8 C)  SpO2: 100%   Gen:   Awake, no distress   Resp:  Normal effort  MSK:   Moves extremities without difficulty  Other:    Medical Decision Making  Given the patient's initial medical screening exam, the following diagnostic evaluation has been ordered. The patient will be placed in the appropriate treatment space, once one is available, to complete the evaluation and treatment. I have discussed the plan of care with the patient and I have advised the patient that an ED physician or mid-level practitioner will reevaluate their condition after the test results have been received, as the results may give them additional insight into the type of treatment they may need.    Diagnostics: Head CT, cervical spine CT, left shoulder x-ray  Treatments: none immediately   Teodoro Spray, [place] 07/17/22 1823

## 2022-07-17 NOTE — ED Provider Notes (Signed)
[facility] Provider Note  Patient Contact: 10:05 PM (approximate)   History   Fall (/)   HPI  Damon Coffey is a 71 y.o. male who presents to the emergency department complaining of left shoulder pain, headache after falling and hitting his head.  Patient rolled out of bed and struck his head on the hardwood floor.  He does have a left-sided headache.  No visual changes.  No radicular symptoms in the upper or lower extremity.  No loss of consciousness at time of injury.  Patient has had cervical discectomy as well as a craniotomy for brain tumor excision.     Physical Exam   Triage Vital Signs: ED Triage Vitals  Enc Vitals Group     BP 07/17/22 1814 (!) 162/82     Pulse Rate 07/17/22 1814 60     Resp 07/17/22 1814 18     Temp 07/17/22 1814 98.2 F (36.8 C)     Temp Source 07/17/22 1814 Oral     SpO2 07/17/22 1814 100 %     Weight 07/17/22 1815 220 lb (99.8 kg)     Height 07/17/22 1815 '5\' 7"'$  (1.702 m)     Head Circumference --      Peak Flow --      Pain Score 07/17/22 1814 5     Pain Loc --      Pain Edu? --      Excl. in [place]? --     Most recent vital signs: Vitals:   07/17/22 1814  BP: (!) 162/82  Pulse: 60  Resp: 18  Temp: 98.2 F (36.8 C)  SpO2: 100%     General: Alert and in no acute distress. Head: No acute traumatic findings  Neck: No stridor. No midline cervical spine tenderness to palpation.  Left-sided paraspinal muscle tenderness to exam.  Cardiovascular:  Good peripheral perfusion Respiratory: Normal respiratory effort without tachypnea or retractions. Lungs CTAB.  Musculoskeletal: Full range of motion to all extremities.  Good range of motion to the left shoulder.  No visible deformity.  Patient is tender along the lateral posterior aspect without palpable abnormality.  Radial pulses sensation intact distally. Neurologic:  No gross focal neurologic deficits are appreciated.  Cranial nerves II through XII grossly  intact. Skin:   No rash noted Other:   ED Results / Procedures / Treatments   Labs (all labs ordered are listed, but only abnormal results are displayed) Labs Reviewed - No data to display   EKG     RADIOLOGY  I personally viewed, evaluated, and interpreted these images as part of my medical decision making, as well as reviewing the written report by the radiologist.  ED Provider Interpretation: No acute traumatic findings on x-ray, CT scan of the head or cervical spine.  Specifically no fractures or intracranial hemorrhage.  Previous craniotomy identified on CT scan of the head.  Cervical fusion identified on cervical spine.  DG Shoulder Left  Result Date: 07/17/2022 CLINICAL DATA:  Status post fall. EXAM: LEFT SHOULDER - 2+ VIEW COMPARISON:  None Available. FINDINGS: There is no evidence of fracture or dislocation. Mild degenerative changes are seen involving the left acromioclavicular joint and left glenohumeral articulation. Soft tissues are unremarkable. IMPRESSION: 1. No acute osseous abnormality. Electronically Signed   By: Virgina Norfolk M.D.   On: 07/17/2022 19:20   CT Head Wo Contrast  Result Date: 07/17/2022 CLINICAL DATA:  Head trauma, moderate to severe EXAM: CT HEAD WITHOUT CONTRAST CT  CERVICAL SPINE WITHOUT CONTRAST TECHNIQUE: Multidetector CT imaging of the head and cervical spine was performed following the standard protocol without intravenous contrast. Multiplanar CT image reconstructions of the cervical spine were also generated. RADIATION DOSE REDUCTION: This exam was performed according to the departmental dose-optimization program which includes automated exposure control, adjustment of the mA and/or kV according to patient size and/or use of iterative reconstruction technique. COMPARISON:  None Available. Head CT 09/03/2019 FINDINGS: CT HEAD FINDINGS Brain: Encephalomalacia in the lateral RIGHT occipital lobe beneath the craniotomy flap consistent prior  surgery. No acute intracranial hemorrhage. No mass lesion. No extra-axial fluid collections. No ventricular dilatation. No CT evidence of cortical infarction. Vascular: No hyperdense vessel or unexpected calcification.  Kink. Skull: . Negative for acute fracture or focal lesion.RIGHT occipital craniotomy Sinuses/Orbits: No acute finding. Other: None CT CERVICAL SPINE FINDINGS Alignment: Normal alignment of the cervical vertebral bodies. Skull base and vertebrae: Normal craniocervical junction. No loss of vertebral body height or disc height. Normal facet articulation. No evidence of fracture. Soft tissues and spinal canal: No prevertebral soft tissue swelling. No perispinal or epidural hematoma. Disc levels:  Anterior cervical fusion from C5-C7. Upper chest: Clear Other: None IMPRESSION: 1. No acute intracranial findings.  No evidence of trauma. 2. Remote RIGHT occipital craniotomy. 3. No cervical spine fracture. 4. Anterior cervical fusion intact Electronically Signed   By: Suzy Bouchard M.D.   On: 07/17/2022 18:57   CT Cervical Spine Wo Contrast  Result Date: 07/17/2022 CLINICAL DATA:  Head trauma, moderate to severe EXAM: CT HEAD WITHOUT CONTRAST CT CERVICAL SPINE WITHOUT CONTRAST TECHNIQUE: Multidetector CT imaging of the head and cervical spine was performed following the standard protocol without intravenous contrast. Multiplanar CT image reconstructions of the cervical spine were also generated. RADIATION DOSE REDUCTION: This exam was performed according to the departmental dose-optimization program which includes automated exposure control, adjustment of the mA and/or kV according to patient size and/or use of iterative reconstruction technique. COMPARISON:  None Available. Head CT 09/03/2019 FINDINGS: CT HEAD FINDINGS Brain: Encephalomalacia in the lateral RIGHT occipital lobe beneath the craniotomy flap consistent prior surgery. No acute intracranial hemorrhage. No mass lesion. No extra-axial fluid  collections. No ventricular dilatation. No CT evidence of cortical infarction. Vascular: No hyperdense vessel or unexpected calcification.  Kink. Skull: . Negative for acute fracture or focal lesion.RIGHT occipital craniotomy Sinuses/Orbits: No acute finding. Other: None CT CERVICAL SPINE FINDINGS Alignment: Normal alignment of the cervical vertebral bodies. Skull base and vertebrae: Normal craniocervical junction. No loss of vertebral body height or disc height. Normal facet articulation. No evidence of fracture. Soft tissues and spinal canal: No prevertebral soft tissue swelling. No perispinal or epidural hematoma. Disc levels:  Anterior cervical fusion from C5-C7. Upper chest: Clear Other: None IMPRESSION: 1. No acute intracranial findings.  No evidence of trauma. 2. Remote RIGHT occipital craniotomy. 3. No cervical spine fracture. 4. Anterior cervical fusion intact Electronically Signed   By: Suzy Bouchard M.D.   On: 07/17/2022 18:57    PROCEDURES:  Critical Care performed: No  Procedures   MEDICATIONS ORDERED IN ED: Medications - No data to display   IMPRESSION / MDM / Okay / ED COURSE  I reviewed the triage vital signs and the nursing notes.                              Differential diagnosis includes, but is not limited to, posttraumatic headache, concussion, intracranial  hemorrhage, skull fracture, cervical spine fracture, cervical strain, left shoulder contusion, shoulder fracture, shoulder dislocation  Patient's presentation is most consistent with acute presentation with potential threat to life or bodily function.   Patient's diagnosis is consistent with fall, posttraumatic headache, shoulder contusion.  Patient presents to the ED after falling out of bed onto hardwood floor.  He did hit his head but no loss of consciousness.  Patient does have a history of previous cervical fusion and craniotomy for a brain tumor.  Imaging is reassuring at this time and no  intracranial hemorrhage, skull fracture or fractures of the left shoulder.  At this time patient is stable for discharge.  I will prescribe the patient a muscle relaxer to assist with symptoms tomorrow.  Follow-up with primary care as needed.  Return precautions discussed with the patient..  Patient is given ED precautions to return to the ED for any worsening or new symptoms.        FINAL CLINICAL IMPRESSION(S) / ED DIAGNOSES   Final diagnoses:  Fall, initial encounter  Acute post-traumatic headache, not intractable  Contusion of left shoulder, initial encounter     Rx / DC Orders   ED Discharge Orders          Ordered    methocarbamol (ROBAXIN) 500 MG tablet  4 times daily        07/17/22 2218             Note:  This document was prepared using Dragon voice recognition software and may include unintentional dictation errors.   Brynda Peon 07/17/22 2218    Blake Divine, MD 07/17/22 2241

## 2022-09-03 ENCOUNTER — Ambulatory Visit (INDEPENDENT_AMBULATORY_CARE_PROVIDER_SITE_OTHER): Payer: Medicare Other | Admitting: Urology

## 2022-09-03 ENCOUNTER — Encounter: Payer: Self-pay | Admitting: Urology

## 2022-09-03 VITALS — BP 148/80 | HR 69 | Ht 67.0 in | Wt 215.0 lb

## 2022-09-03 DIAGNOSIS — N400 Enlarged prostate without lower urinary tract symptoms: Secondary | ICD-10-CM

## 2022-09-03 DIAGNOSIS — R351 Nocturia: Secondary | ICD-10-CM

## 2022-09-03 DIAGNOSIS — N401 Enlarged prostate with lower urinary tract symptoms: Secondary | ICD-10-CM | POA: Diagnosis not present

## 2022-09-03 LAB — BLADDER SCAN AMB NON-IMAGING: Scan Result: 45

## 2022-09-03 MED ORDER — GEMTESA 75 MG PO TABS: 75.0000 mg | ORAL_TABLET | Freq: Every day | ORAL | 0 refills | Status: AC

## 2022-09-03 NOTE — Progress Notes (Unsigned)
09/03/2022 1:40 PM   Damon Coffey December 06, 1950 [id number]  Referring provider: Leonel Ramsay, MD [place],  [place] [address]  Chief Complaint  Patient presents with   Benign Prostatic Hypertrophy    HPI: Damon Coffey is a 71 y.o. male referred for evaluation of nocturia.  [facility] visit 08/01/2022 with complaints of bothersome nocturia On tamsulosin Was on a diuretic and recommended to take earlier in the day in an attempt to decrease nighttime voids without improvement Voids ~ 2 q. hours at night Has obstructive sleep apnea and waiting for his CPAP machine Denies dysuria, gross hematuria No flank, abdominal or pelvic pain IPSS today 23/35; his voiding symptoms are worse at night including obstructive symptoms   PMH: Past Medical History:  Diagnosis Date   Enlarged prostate    Hypertension     Surgical History: Past Surgical History:  Procedure Laterality Date   BRAIN TUMOR EXCISION     CERVICAL DISCECTOMY      Home Medications:  Allergies as of 09/03/2022       Reactions   Contrast Media [iodinated Contrast Media] Other (See Comments)   Flu like symptoms        Medication List        Accurate as of September 03, 2022  1:40 PM. If you have any questions, ask your nurse or doctor.          atorvastatin 10 MG tablet Commonly known as: LIPITOR Take 10 mg by mouth daily.   famotidine 40 MG tablet Commonly known as: PEPCID Take 1 tablet (40 mg total) every evening by mouth.   methocarbamol 500 MG tablet Commonly known as: ROBAXIN Take 1 tablet (500 mg total) by mouth 4 (four) times daily.   sildenafil 100 MG tablet Commonly known as: VIAGRA TAKE 1/2 TO 1 TABLET BY MOUTH AS NEEDED   sucralfate 1 g tablet Commonly known as: Carafate Take 1 tablet (1 g total) 4 (four) times daily by mouth.        Allergies:  Allergies  Allergen Reactions   Contrast Media [Iodinated Contrast Media] Other (See Comments)    Flu  like symptoms    Family History: History reviewed. No pertinent family history.  Social History:  reports that he has quit smoking. He has never used smokeless tobacco. He reports current alcohol use of about 2.0 standard drinks of alcohol per week. No history on file for drug use.   Physical Exam: BP (!) 148/80   Pulse 69   Ht '5\' 7"'$  (1.702 m)   Wt 215 lb (97.5 kg)   BMI 33.67 kg/m   Constitutional:  Alert and oriented, No acute distress. HEENT: [place] AT Respiratory: Normal respiratory effort, no increased work of breathing. GI: Abdomen is soft, nontender, nondistended, no abdominal masses GU: Prostate 35 g, smooth without nodules Skin: No rashes, bruises or suspicious lesions. Neurologic: Grossly intact, no focal deficits, moving all 4 extremities. Psychiatric: Normal mood and affect.  Laboratory Data:  Urinalysis Dipstick/microscopy negative   Assessment & Plan:    1.  BPH with LUTS Continue tamsulosin Will add Gemtesa 75 mg daily for his storage related voiding symptoms ***IN Hopefully some of his nighttime symptoms will improve once he starts CPAP PVR today 45 mL 1 follow-up symptom recheck with PVR   2.  Nocturia We discussed nocturia is a difficult symptom to treat and it is the 1 voiding symptom least resistant to various therapies secondary to multiple potential etiologies  including BPH, bladder overactivity, nocturnal polyuria and sleep apnea We discussed his symptoms may significantly improved once he starts [place], [place] [address], [place], [place] [address] [phone]

## 2022-09-04 LAB — URINALYSIS, COMPLETE
Bilirubin, UA: NEGATIVE
Glucose, UA: NEGATIVE
Ketones, UA: NEGATIVE
Leukocytes,UA: NEGATIVE
Nitrite, UA: NEGATIVE
Protein,UA: NEGATIVE
RBC, UA: NEGATIVE
Specific Gravity, UA: 1.01 (ref 1.005–1.030)
Urobilinogen, Ur: 0.2 mg/dL (ref 0.2–1.0)
pH, UA: 6 (ref 5.0–7.5)

## 2022-09-04 LAB — MICROSCOPIC EXAMINATION
Bacteria, UA: NONE SEEN
RBC, Urine: NONE SEEN /hpf (ref 0–2)

## 2022-09-05 ENCOUNTER — Encounter: Payer: Self-pay | Admitting: Urology

## 2022-10-04 ENCOUNTER — Ambulatory Visit: Payer: Medicare Other

## 2022-10-05 ENCOUNTER — Encounter: Payer: Self-pay | Admitting: Urology

## 2022-10-05 ENCOUNTER — Ambulatory Visit (INDEPENDENT_AMBULATORY_CARE_PROVIDER_SITE_OTHER): Payer: Medicare Other | Admitting: Urology

## 2022-10-05 VITALS — BP 144/82 | HR 67 | Ht 67.0 in | Wt 220.0 lb

## 2022-10-05 DIAGNOSIS — R351 Nocturia: Secondary | ICD-10-CM

## 2022-10-05 DIAGNOSIS — N401 Enlarged prostate with lower urinary tract symptoms: Secondary | ICD-10-CM

## 2022-10-05 LAB — BLADDER SCAN AMB NON-IMAGING

## 2022-10-05 NOTE — Progress Notes (Signed)
10/05/2022 1:37 PM   Damon Coffey 1951-08-27 [id number]  Referring provider: Leonel Ramsay, MD [place],  [place] [address]  Chief Complaint  Patient presents with   Follow-up    HPI: 71 y.o. male presents for 1 month follow-up visit.Marland Kitchen  Refer to my prior note 09/03/2022 Did note less daytime frequency and nocturia on Gemtesa.  He has not completed the samples Thanks his CPAP is available for pickup   PMH: Past Medical History:  Diagnosis Date   Enlarged prostate    Hypertension     Surgical History: Past Surgical History:  Procedure Laterality Date   BRAIN TUMOR EXCISION     CERVICAL DISCECTOMY      Home Medications:  Allergies as of 10/05/2022       Reactions   Iodinated Contrast Media Other (See Comments)   Flu like symptoms " old type of xray dye" caused flu like symptoms  Flu like symptoms " old type of xray dye" caused flu like symptoms Flu like symptoms    " old type of xray dye" caused flu like symptoms Flu like symptoms    " old type of xray dye" caused flu like symptoms Other Reaction(s): Other (See Comments)    " old type of xray dye" caused flu like symptoms    Flu like symptoms  " old type of xray dye" caused flu like symptoms Flu like symptoms  " old type of xray dye" caused flu like symptoms    " old type of xray dye" caused flu like symptoms Flu like symptoms    Flu like symptoms        Medication List        Accurate as of October 05, 2022  1:37 PM. If you have any questions, ask your nurse or doctor.          acetaminophen 500 MG tablet Commonly known as: TYLENOL Take by mouth.   atorvastatin 10 MG tablet Commonly known as: LIPITOR Take 10 mg by mouth daily.   brimonidine 0.2 % ophthalmic solution Commonly known as: ALPHAGAN 1 drop 2 (two) times daily.   dorzolamide-timolol 2-0.5 % ophthalmic solution Commonly known as: COSOPT 1 drop 2 (two) times daily.   erythromycin ophthalmic  ointment APPLY A SMALL AMOUNT IN THE RIGHT EYE AS NEEDED   famotidine 40 MG tablet Commonly known as: PEPCID Take 1 tablet (40 mg total) every evening by mouth.   finasteride 5 MG tablet Commonly known as: PROSCAR Take by mouth.   fluticasone 50 MCG/ACT nasal spray Commonly known as: FLONASE Place into the nose.   Gemtesa 75 MG Tabs Generic drug: Vibegron Take 75 mg by mouth daily.   hydrochlorothiazide 25 MG tablet Commonly known as: HYDRODIURIL Take by mouth.   methocarbamol 500 MG tablet Commonly known as: ROBAXIN Take 1 tablet (500 mg total) by mouth 4 (four) times daily.   neomycin-polymyxin b-dexamethasone 3.5-10000-0.1 Susp Commonly known as: MAXITROL 1 drop 3 (three) times daily.   sildenafil 100 MG tablet Commonly known as: VIAGRA TAKE 1/2 TO 1 TABLET BY MOUTH AS NEEDED   sucralfate 1 g tablet Commonly known as: Carafate Take 1 tablet (1 g total) 4 (four) times daily by mouth.   tamsulosin 0.4 MG Caps capsule Commonly known as: FLOMAX Take by mouth.   Vitamin D-1000 Max St 25 MCG (1000 UT) tablet Generic drug: Cholecalciferol Take by mouth.        Allergies:  Allergies  Allergen Reactions  Iodinated Contrast Media Other (See Comments)    Flu like symptoms  " old type of xray dye" caused flu like symptoms  Flu like symptoms  " old type of xray dye" caused flu like symptoms  Flu like symptoms    " old type of xray dye" caused flu like symptoms Flu like symptoms    " old type of xray dye" caused flu like symptoms  Other Reaction(s): Other (See Comments)    " old type of xray dye" caused flu like symptoms    Flu like symptoms  " old type of xray dye" caused flu like symptoms Flu like symptoms  " old type of xray dye" caused flu like symptoms    " old type of xray dye" caused flu like symptoms Flu like symptoms    Flu like symptoms    Family History: No family history on file.  Social History:  reports that he has quit smoking. He  has never used smokeless tobacco. He reports current alcohol use of about 2.0 standard drinks of alcohol per week. No history on file for drug use.   Physical Exam: BP (!) 144/82 (BP Location: Left Arm, Patient Position: Sitting, Cuff Size: Large)   Pulse 67   Ht '5\' 7"'$  (1.702 m)   Wt 220 lb (99.8 kg)   BMI 34.46 kg/m   Constitutional:  Alert and oriented, No acute distress. HEENT: [place] AT Respiratory: Normal respiratory effort, no increased work of breathing. Psychiatric: Normal mood and affect.   Assessment & Plan:    1.  BPH with LUTS Some improvement in daytime symptoms on Gemtesa Continue tamsulosin PVR today 76 mL He wants to complete the samples before he decides if he wants to continue the medication   2.  Nocturia As above We discussed his nocturia may improve once he starts CPAP  Schedule 6 month follow-up; call earlier for worsening voiding symptoms   Abbie Sons, MD  [facility] [address], [place], [place] [address] [phone]

## 2023-04-12 ENCOUNTER — Ambulatory Visit: Payer: Medicare Other | Admitting: Urology

## 2023-04-19 ENCOUNTER — Ambulatory Visit (INDEPENDENT_AMBULATORY_CARE_PROVIDER_SITE_OTHER): Payer: Medicare Other | Admitting: Urology

## 2023-04-19 ENCOUNTER — Encounter: Payer: Self-pay | Admitting: Urology

## 2023-04-19 VITALS — BP 126/82 | HR 81 | Ht 67.0 in | Wt 220.0 lb

## 2023-04-19 DIAGNOSIS — R351 Nocturia: Secondary | ICD-10-CM | POA: Diagnosis not present

## 2023-04-19 DIAGNOSIS — N401 Enlarged prostate with lower urinary tract symptoms: Secondary | ICD-10-CM | POA: Diagnosis not present

## 2023-04-19 LAB — BLADDER SCAN AMB NON-IMAGING: Scan Result: 106

## 2023-04-19 NOTE — Progress Notes (Signed)
Damon Coffey,acting as a scribe for Riki Altes, MD.,have documented all relevant documentation on the behalf of Riki Altes, MD,as directed by  Riki Altes, MD while in the presence of Riki Altes, MD.  04/19/2023 10:34 AM   Damon Coffey [birth year] [id number]  Referring provider: Mick Sell, MD [address] [place],  [place] [address]  Chief Complaint  Patient presents with   Follow-up    follow-up   Benign Prostatic Hypertrophy    HPI: Damon Coffey is a 72 y.o. male who presents today for follow up.   Last visit in 09/2022 and he had noted improvement in daytime voiding symptoms on gemtesa He also had bothersome nocturia but was diagnosed with sleep apnea and not yet started CPAP Doing well since last visit No bothersome LUTS Denies dysuria, gross hematuria Denies flank, abdominal or pelvic pain Remains on tamsulosin and gemtesa He did start CPAP and has noted significant improvement in his nocturia. He currently gets up once per night on average and occasionally twice per night  PMH: Past Medical History:  Diagnosis Date   Enlarged prostate    Hypertension     Surgical History: Past Surgical History:  Procedure Laterality Date   BRAIN TUMOR EXCISION     CERVICAL DISCECTOMY      Home Medications:  Allergies as of 04/19/2023       Reactions   Iodinated Contrast Media Other (See Comments)   Flu like symptoms " old type of xray dye" caused flu like symptoms  Flu like symptoms " old type of xray dye" caused flu like symptoms Flu like symptoms    " old type of xray dye" caused flu like symptoms Flu like symptoms    " old type of xray dye" caused flu like symptoms Other Reaction(s): Other (See Comments)    " old type of xray dye" caused flu like symptoms    Flu like symptoms  " old type of xray dye" caused flu like symptoms Flu like symptoms  " old type of xray dye" caused flu like symptoms    " old type of xray  dye" caused flu like symptoms Flu like symptoms    Flu like symptoms        Medication List        Accurate as of Apr 19, 2023 10:34 AM. If you have any questions, ask your nurse or doctor.          acetaminophen 500 MG tablet Commonly known as: TYLENOL Take by mouth.   atorvastatin 10 MG tablet Commonly known as: LIPITOR Take 10 mg by mouth daily.   brimonidine 0.2 % ophthalmic solution Commonly known as: ALPHAGAN 1 drop 2 (two) times daily.   dorzolamide-timolol 2-0.5 % ophthalmic solution Commonly known as: COSOPT 1 drop 2 (two) times daily.   erythromycin ophthalmic ointment APPLY A SMALL AMOUNT IN THE RIGHT EYE AS NEEDED   famotidine 40 MG tablet Commonly known as: PEPCID Take 1 tablet (40 mg total) every evening by mouth.   finasteride 5 MG tablet Commonly known as: PROSCAR Take by mouth.   fluticasone 50 MCG/ACT nasal spray Commonly known as: FLONASE Place into the nose.   Gemtesa 75 MG Tabs Generic drug: Vibegron Take 75 mg by mouth daily.   hydrochlorothiazide 25 MG tablet Commonly known as: HYDRODIURIL Take by mouth.   methocarbamol 500 MG tablet Commonly known as: ROBAXIN Take 1 tablet (500 mg total) by mouth  4 (four) times daily.   neomycin-polymyxin b-dexamethasone 3.5-10000-0.1 Susp Commonly known as: MAXITROL 1 drop 3 (three) times daily.   sildenafil 100 MG tablet Commonly known as: VIAGRA TAKE 1/2 TO 1 TABLET BY MOUTH AS NEEDED   sucralfate 1 g tablet Commonly known as: Carafate Take 1 tablet (1 g total) 4 (four) times daily by mouth.   tamsulosin 0.4 MG Caps capsule Commonly known as: FLOMAX Take by mouth.   Vitamin D-1000 Max St 25 MCG (1000 UT) tablet Generic drug: Cholecalciferol Take by mouth.        Allergies:  Allergies  Allergen Reactions   Iodinated Contrast Media Other (See Comments)    Flu like symptoms  " old type of xray dye" caused flu like symptoms  Flu like symptoms  " old type of xray dye"  caused flu like symptoms  Flu like symptoms    " old type of xray dye" caused flu like symptoms Flu like symptoms    " old type of xray dye" caused flu like symptoms  Other Reaction(s): Other (See Comments)    " old type of xray dye" caused flu like symptoms    Flu like symptoms  " old type of xray dye" caused flu like symptoms Flu like symptoms  " old type of xray dye" caused flu like symptoms    " old type of xray dye" caused flu like symptoms Flu like symptoms    Flu like symptoms    Social History:  reports that he has quit smoking. He has been exposed to tobacco smoke. He has never used smokeless tobacco. He reports current alcohol use of about 2.0 standard drinks of alcohol per week. No history on file for drug use.   Physical Exam: BP 126/82   Pulse 81   Ht 5\' 7"  (1.702 m)   Wt 220 lb (99.8 kg)   BMI 34.46 kg/m   Constitutional:  Alert and oriented, No acute distress. HEENT: [place] AT, moist mucus membranes.  Trachea midline, no masses. Cardiovascular: No clubbing, cyanosis, or edema. Respiratory: Normal respiratory effort, no increased work of breathing. GI: Abdomen is soft, nontender, nondistended, no abdominal masses Skin: No rashes, bruises or suspicious lesions. Neurologic: Grossly intact, no focal deficits, moving all 4 extremities. Psychiatric: Normal mood and affect.  Laboratory Data:  PVR today stable at 106 mL   Assessment & Plan:    1. BPH with LUTs Doing well on tamsulosin and gemtesa PVR today stable at 106 mL Annual follow up with PVR and instructed to call earlier for any worsening voiding symptoms  2. Nocturia Significant improvement on CPAP  [facility] [address], [address] [place], [place] [address] [phone]

## 2023-05-28 ENCOUNTER — Encounter: Payer: Self-pay | Admitting: Ophthalmology

## 2023-05-28 NOTE — Anesthesia Preprocedure Evaluation (Addendum)
Anesthesia Evaluation  Patient identified by MRN, date of birth, ID band Patient awake    Reviewed: Allergy & Precautions, H&P , NPO status , Patient's Chart, lab work & pertinent test results  Airway Mallampati: III  TM Distance: >3 FB Neck ROM: Full   Comment: Short thick neck Dental no notable dental hx.    Pulmonary neg pulmonary ROS, sleep apnea and Continuous Positive Airway Pressure Ventilation , former smoker 15 pack year history of smoking, quit 35 years ago   Pulmonary exam normal breath sounds clear to auscultation       Cardiovascular hypertension, Normal cardiovascular exam+ dysrhythmias Atrial Fibrillation  Rhythm:Regular Rate:Normal  Hx atrial fibrillation but I cannot find echo on Epic. 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  Refusal of blood transfusions as patient is Jehovah's Witness 06/08/2014  Sleep apnea  Uses CPAP, AHI: 45.7/hr  Enlarged prostate  Hypertension Sleep apnea  Weakness of right side of body History of atrial fibrillation  History of anxiety  Very pleasant gentleman   Reproductive/Obstetrics negative OB ROS                              Anesthesia Physical Anesthesia Plan  ASA: 3  Anesthesia Plan: MAC   Post-op Pain Management:    Induction: Intravenous  PONV Risk Score and Plan:   Airway Management Planned: Natural Airway and Nasal  Cannula  Additional Equipment:   Intra-op Plan:   Post-operative Plan:   Informed Consent: I have reviewed the patients History and Physical, chart, labs and discussed the procedure including the risks, benefits and alternatives for the proposed anesthesia with the patient or authorized representative who has indicated his/her understanding and acceptance.     Dental Advisory Given  Plan Discussed with: Anesthesiologist, CRNA and Surgeon  Anesthesia Plan Comments: (Patient consented for risks of anesthesia including but not limited to:  - adverse reactions to medications - damage to eyes, teeth, lips or other oral mucosa - nerve damage due to positioning  - sore throat or hoarseness - Damage to heart, brain, nerves, lungs, other parts of body or loss of life  Patient voiced understanding.)         Anesthesia Quick Evaluation

## 2023-05-31 ENCOUNTER — Ambulatory Visit: Payer: Medicare Other

## 2023-05-31 DIAGNOSIS — Z719 Counseling, unspecified: Secondary | ICD-10-CM

## 2023-05-31 DIAGNOSIS — Z23 Encounter for immunization: Secondary | ICD-10-CM | POA: Diagnosis not present

## 2023-05-31 NOTE — Progress Notes (Signed)
Pt seen in nurse clinic with wife. Pt requested Pfizer. Reviewed NCIR, eligible, adminIstered Pfizer Comirnaty 12y+, yr 2023-2024, monitored for 15 min without problems. Given VIS and NCIR copy, explained and understood. M.Rahmon Heigl, LPN.

## 2023-06-03 NOTE — Discharge Instructions (Signed)

## 2023-06-05 ENCOUNTER — Other Ambulatory Visit: Payer: Self-pay

## 2023-06-05 ENCOUNTER — Encounter: Payer: Self-pay | Admitting: Ophthalmology

## 2023-06-05 ENCOUNTER — Ambulatory Visit: Payer: Medicare Other | Admitting: Anesthesiology

## 2023-06-05 ENCOUNTER — Ambulatory Visit
Admission: RE | Admit: 2023-06-05 | Discharge: 2023-06-05 | Disposition: A | Discharge status: Home / Self Care | Payer: Medicare Other | Attending: Ophthalmology | Admitting: Ophthalmology

## 2023-06-05 ENCOUNTER — Encounter
Admission: RE | Disposition: A | Discharge status: Home / Self Care | Payer: Self-pay | Source: Home / Self Care | Attending: Ophthalmology

## 2023-06-05 DIAGNOSIS — G4730 Sleep apnea, unspecified: Secondary | ICD-10-CM | POA: Insufficient documentation

## 2023-06-05 DIAGNOSIS — Z87891 Personal history of nicotine dependence: Secondary | ICD-10-CM | POA: Diagnosis not present

## 2023-06-05 DIAGNOSIS — H401112 Primary open-angle glaucoma, right eye, moderate stage: Secondary | ICD-10-CM | POA: Diagnosis not present

## 2023-06-05 DIAGNOSIS — F419 Anxiety disorder, unspecified: Secondary | ICD-10-CM | POA: Diagnosis not present

## 2023-06-05 DIAGNOSIS — I10 Essential (primary) hypertension: Secondary | ICD-10-CM | POA: Insufficient documentation

## 2023-06-05 DIAGNOSIS — Z79899 Other long term (current) drug therapy: Secondary | ICD-10-CM | POA: Diagnosis not present

## 2023-06-05 DIAGNOSIS — I4891 Unspecified atrial fibrillation: Secondary | ICD-10-CM | POA: Insufficient documentation

## 2023-06-05 DIAGNOSIS — H2511 Age-related nuclear cataract, right eye: Secondary | ICD-10-CM | POA: Insufficient documentation

## 2023-06-05 DIAGNOSIS — K219 Gastro-esophageal reflux disease without esophagitis: Secondary | ICD-10-CM | POA: Diagnosis not present

## 2023-06-05 DIAGNOSIS — N400 Enlarged prostate without lower urinary tract symptoms: Secondary | ICD-10-CM | POA: Diagnosis not present

## 2023-06-05 HISTORY — DX: Sleep apnea, unspecified: G47.30

## 2023-06-05 HISTORY — DX: Personal history of other diseases of the circulatory system: Z86.79

## 2023-06-05 HISTORY — PX: CATARACT EXTRACTION W/PHACO: SHX586

## 2023-06-05 HISTORY — DX: Gastro-esophageal reflux disease without esophagitis: K21.9

## 2023-06-05 HISTORY — DX: Weakness: R53.1

## 2023-06-05 HISTORY — DX: Personal history of other mental and behavioral disorders: Z86.59

## 2023-06-05 SURGERY — PHACOEMULSIFICATION, CATARACT, WITH IOL INSERTION
Anesthesia: Monitor Anesthesia Care | Laterality: Right

## 2023-06-05 MED ORDER — MIDAZOLAM HCL 2 MG/2ML IJ SOLN
INTRAMUSCULAR | Status: DC | PRN
  Administered 2023-06-05 (×2): 1 mg via INTRAVENOUS

## 2023-06-05 MED ORDER — SODIUM CHLORIDE 0.9% FLUSH
INTRAVENOUS | Status: DC | PRN
  Administered 2023-06-05 (×2): 10 mL via INTRAVENOUS

## 2023-06-05 MED ORDER — SIGHTPATH DOSE#1 BSS IO SOLN
INTRAOCULAR | Status: DC | PRN
  Administered 2023-06-05: 33 mL via OPHTHALMIC

## 2023-06-05 MED ORDER — DEXMEDETOMIDINE HCL IN NACL 200 MCG/50ML IV SOLN
INTRAVENOUS | Status: DC | PRN
  Administered 2023-06-05: 4 ug via INTRAVENOUS

## 2023-06-05 MED ORDER — NA CHONDROIT SULF-NA HYALURON 40-30 MG/ML IO SOSY
INTRAOCULAR | Status: DC | PRN
  Administered 2023-06-05: .5 mL via INTRAOCULAR

## 2023-06-05 MED ORDER — SIGHTPATH DOSE#1 NA HYALUR & NA CHOND-NA HYALUR IO KIT
PACK | INTRAOCULAR | Status: DC | PRN
  Administered 2023-06-05: 1 via OPHTHALMIC

## 2023-06-05 MED ORDER — SIGHTPATH DOSE#1 BSS IO SOLN
INTRAOCULAR | Status: DC | PRN
  Administered 2023-06-05: 2 mL

## 2023-06-05 MED ORDER — FENTANYL CITRATE (PF) 100 MCG/2ML IJ SOLN
INTRAMUSCULAR | Status: DC | PRN
  Administered 2023-06-05 (×2): 25 ug via INTRAVENOUS

## 2023-06-05 MED ORDER — LACTATED RINGERS IV SOLN: INTRAVENOUS | Status: DC

## 2023-06-05 MED ORDER — SIGHTPATH DOSE#1 BSS IO SOLN
INTRAOCULAR | Status: DC | PRN
  Administered 2023-06-05 (×2): 15 mL via INTRAOCULAR

## 2023-06-05 MED ORDER — ARMC OPHTHALMIC DILATING DROPS
1.0000 | OPHTHALMIC | Status: DC | PRN
  Administered 2023-06-05 (×3): 1 via OPHTHALMIC

## 2023-06-05 MED ORDER — CEFUROXIME OPHTHALMIC INJECTION 1 MG/0.1 ML
INJECTION | OPHTHALMIC | Status: DC | PRN
  Administered 2023-06-05: .1 mL via INTRACAMERAL

## 2023-06-05 MED ORDER — TETRACAINE HCL 0.5 % OP SOLN
1.0000 [drp] | OPHTHALMIC | Status: DC | PRN
  Administered 2023-06-05 (×3): 1 [drp] via OPHTHALMIC

## 2023-06-05 SURGICAL SUPPLY — 16 items
BLADE DUAL KAHOOK SINGLE USE (BLADE) IMPLANT
CANNULA ANT/CHMB 27G (MISCELLANEOUS) IMPLANT
CANNULA ANT/CHMB 27GA (MISCELLANEOUS) IMPLANT
CATARACT SUITE SIGHTPATH (MISCELLANEOUS) ×1 IMPLANT
FEE CATARACT SUITE SIGHTPATH (MISCELLANEOUS) ×1 IMPLANT
GLOVE SRG 8 PF TXTR STRL LF DI (GLOVE) ×1 IMPLANT
GLOVE SURG ENC TEXT LTX SZ7.5 (GLOVE) ×1 IMPLANT
GLOVE SURG UNDER POLY LF SZ8 (GLOVE) ×1
ICLIP (OPHTHALMIC RELATED) IMPLANT
LENS IOL DIOP 18.5 (Intraocular Lens) ×1 IMPLANT
LENS IOL TECNIS MONO 18.5 (Intraocular Lens) IMPLANT
NDL FILTER BLUNT 18X1 1/2 (NEEDLE) ×1 IMPLANT
NDL RETROBULBAR .5 NSTRL (NEEDLE) IMPLANT
NEEDLE FILTER BLUNT 18X1 1/2 (NEEDLE) ×1 IMPLANT
RING MALYGIN 7.0 (MISCELLANEOUS) IMPLANT
SYR 3ML LL SCALE MARK (SYRINGE) ×1 IMPLANT

## 2023-06-05 NOTE — Op Note (Signed)
PREOPERATIVE DIAGNOSIS:  Nuclear sclerotic cataract  right eye. 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 Item Serial No. Manufacturer Lot No. LRB No. Used Action  LENS IOL DIOP 18.5 - [id number] Intraocular Lens LENS IOL DIOP 18.5 [id number] [employer]  Right 1 Implanted    SURGEON:  Deirdre Evener, MD   ANESTHESIA:  Topical with tetracaine drops augmented with 1% preservative-free intracameral lidocaine.    COMPLICATIONS:  None.   DESCRIPTION OF PROCEDURE:  The patient was identified in the holding room and transported to the operating room and placed in the supine position under the operating microscope.  The right eye was identified as the operative eye and it was prepped and draped in the usual sterile ophthalmic fashion.   A 1 millimeter clear-corneal paracentesis was made at the 12:00 position.  0.5 ml of preservative-free 1% lidocaine was injected into the anterior chamber.  The anterior chamber was filled with Viscoat viscoelastic.  A 2.4 millimeter keratome was used to make a near-clear corneal incision at the 9:00 position. The microscope was adjusted and a gonioprism was used to visulaize the trabecular meshwork.  The [facility] Dual Blade was advanced across the anterior chamber under viscoelastic.  The blade was used to mark the trabecular meshwork at the 1:30 position.  The blade was placed two clock hours clockwise into the meshwork.  Proper postioning was confirmed.  The  blade ws passed counterclockwise through the meshwork to excise approximately two to three clock-hours of trabecular meshwork.   A curvilinear capsulorrhexis was made with a cystotome and capsulorrhexis forceps.  Balanced salt solution was used to hydrodissect and hydrodelineate the nucleus.   Phacoemulsification was then used in stop and chop fashion to remove the lens nucleus and epinucleus.  The remaining cortex was then removed using the irrigation and aspiration handpiece. Provisc was then placed into the capsular bag to distend it for lens placement.  A lens was then injected into the capsular bag.  The remaining viscoelastic was aspirated.   Wounds were hydrated with balanced salt solution.  The anterior chamber was inflated to a physiologic pressure with balanced salt solution.  No wound leaks were noted. Cefuroxime 0.1 ml of a 10mg /ml solution was injected into the anterior chamber for a dose of 1 mg of intracameral antibiotic at the completion of the case.  The patient was taken to the recovery room in stable condition without complications of anesthesia or surgery.

## 2023-06-05 NOTE — Transfer of Care (Signed)
Immediate Anesthesia Transfer of Care Note  Patient: Damon Coffey  Procedure(s) Performed: CATARACT EXTRACTION PHACO AND INTRAOCULAR LENS PLACEMENT (IOC) RIGHT [place] DUAL BLADE GONIOTOMY 7.22 00:45.8 (Right)  Patient Location: PACU  Anesthesia Type:MAC  Level of Consciousness: awake, alert , and oriented  Airway & Oxygen Therapy: Patient Spontanous Breathing  Post-op Assessment: Report given to RN and Post -op Vital signs reviewed and stable  Post vital signs: Reviewed and stable  Last Vitals: See PACU flow sheet for temp.  VSS Vitals Value Taken Time  BP 127/66 06/05/23 1407  Temp    Pulse 57 06/05/23 1407  Resp 12 06/05/23 1408  SpO2 97 % 06/05/23 1407  Vitals shown include unvalidated device data.  Last Pain:  Vitals:   06/05/23 1226  TempSrc: Temporal  PainSc: 0-No pain         Complications: No notable events documented.

## 2023-06-05 NOTE — Anesthesia Postprocedure Evaluation (Signed)
Anesthesia Post Note  Patient: Damon Coffey  Procedure(s) Performed: CATARACT EXTRACTION PHACO AND INTRAOCULAR LENS PLACEMENT (IOC) RIGHT [place] DUAL BLADE GONIOTOMY 7.22 00:45.8 (Right)  Patient location during evaluation: PACU Anesthesia Type: MAC Level of consciousness: awake and alert Pain management: pain level controlled Vital Signs Assessment: post-procedure vital signs reviewed and stable Respiratory status: spontaneous breathing, nonlabored ventilation, respiratory function stable and patient connected to nasal cannula oxygen Cardiovascular status: stable and blood pressure returned to baseline Postop Assessment: no apparent nausea or vomiting Anesthetic complications: no   No notable events documented.   Last Vitals:  Vitals:   06/05/23 1407 06/05/23 1411  BP: 127/66 127/75  Pulse: (!) 57 (!) 57  Resp: 12 12  Temp: 36.9 C   SpO2: 97% 94%    Last Pain:  Vitals:   06/05/23 1411  TempSrc:   PainSc: 0-No pain                 Jabria Loos C Mariyanna Mucha

## 2023-06-05 NOTE — H&P (Signed)
[facility]   Primary Care Physician:  Mick Sell, MD Ophthalmologist: Dr. Lockie Mola  Pre-Procedure History & Physical: HPI:  Damon Coffey is a 72 y.o. male here for ophthalmic surgery.   Past Medical History:  Diagnosis Date   Enlarged prostate    GERD (gastroesophageal reflux disease)    History of anxiety    History of atrial fibrillation    Hypertension    Sleep apnea    CPAP   Weakness of right side of body    Arm and leg weakness "since surgery"    Past Surgical History:  Procedure Laterality Date   BRAIN TUMOR EXCISION     CERVICAL DISCECTOMY      Prior to Admission medications   Medication Sig Start Date End Date Taking? Authorizing Provider  acetaminophen (TYLENOL) 500 MG tablet Take by mouth.   Yes [provider]  atorvastatin (LIPITOR) 10 MG tablet Take 10 mg by mouth daily. 08/17/22  Yes [provider]  brimonidine (ALPHAGAN) 0.2 % ophthalmic solution 1 drop 2 (two) times daily.   Yes [provider]  Cholecalciferol (VITAMIN D-1000 MAX ST) 25 MCG (1000 UT) tablet Take by mouth.   Yes [provider]  Digestive Enzymes (DIGESTIVE ENZYME PO) Take by mouth daily.   Yes [provider]  dorzolamide-timolol (COSOPT) 2-0.5 % ophthalmic solution 1 drop 2 (two) times daily.   Yes [provider]  erythromycin ophthalmic ointment APPLY A SMALL AMOUNT IN THE RIGHT EYE AS NEEDED 09/25/21  Yes [provider]  famotidine (PEPCID) 40 MG tablet Take 1 tablet (40 mg total) every evening by mouth. Patient taking differently: Take 40 mg by mouth as needed. 10/09/17  Yes Phineas Semen, MD  finasteride (PROSCAR) 5 MG tablet Take by mouth as needed. 08/17/22 08/17/23 Yes [provider]  fluticasone (FLONASE) 50 MCG/ACT nasal spray Place into the nose daily as needed.   Yes [provider]  hydrochlorothiazide (HYDRODIURIL) 25 MG tablet Take by mouth. 03/21/22  Yes  [provider]  methocarbamol (ROBAXIN) 500 MG tablet Take 1 tablet (500 mg total) by mouth 4 (four) times daily. Patient taking differently: Take 500 mg by mouth 4 (four) times daily as needed. 07/17/22  Yes Cuthriell, Delorise Royals, PA-C  neomycin-polymyxin b-dexamethasone (MAXITROL) 3.5-10000-0.1 SUSP 1 drop 3 (three) times daily. 09/10/22  Yes [provider]  Probiotic Product (PROBIOTIC DAILY PO) Take by mouth daily.   Yes [provider]  psyllium (METAMUCIL) 58.6 % powder Take 1 packet by mouth daily.   Yes [provider]  sucralfate (CARAFATE) 1 g tablet Take 1 tablet (1 g total) 4 (four) times daily by mouth. Patient taking differently: Take 1 g by mouth 4 (four) times daily as needed. 10/09/17  Yes Phineas Semen, MD  tamsulosin (FLOMAX) 0.4 MG CAPS capsule Take 1.6 mg by mouth daily.   Yes [provider]  TURMERIC-GINGER PO Take by mouth daily.   Yes [provider]  Vibegron (GEMTESA) 75 MG TABS Take 75 mg by mouth daily. Patient not taking: Reported on 05/28/2023 09/03/22   Riki Altes, MD    Allergies as of 05/08/2023 - Review Complete 04/19/2023  Allergen Reaction Noted   Iodinated contrast media Other (See Comments) 01/05/2014    History reviewed. No pertinent family history.  Social History   Socioeconomic History   Marital status: Married    Spouse name: Not on file   Number of children: Not on file  Years of education: Not on file   Highest education level: Not on file  Occupational History   Not on file  Tobacco Use   Smoking status: Former    Types: Cigarettes    Quit date: 57    Years since quitting: 36.5    Passive exposure: Past   Smokeless tobacco: Never  Vaping Use   Vaping Use: Never used  Substance and Sexual Activity   Alcohol use: Yes    Alcohol/week: 4.0 standard drinks of alcohol    Types: 4 Standard drinks or equivalent per week   Drug use: Not on file   Sexual activity: Not on  file  Other Topics Concern   Not on file  Social History Narrative   Not on file   Social Determinants of Health   Financial Resource Strain: Not on file  Food Insecurity: Not on file  Transportation Needs: Not on file  Physical Activity: Not on file  Stress: Not on file  Social Connections: Not on file  Intimate Partner Violence: Not on file    Review of Systems: See HPI, otherwise negative ROS  Physical Exam: BP (!) 173/75   Pulse (!) 55   Temp 97.7 F (36.5 C) (Temporal)   Resp 13   Ht 5' 7.01" (1.702 m)   Wt 101.7 kg   SpO2 100%   BMI 35.09 kg/m  General:   Alert,  pleasant and cooperative in NAD Head:  Normocephalic and atraumatic. Lungs:  Clear to auscultation.    Heart:  Regular rate and rhythm.   Impression/Plan: Damon Coffey is here for ophthalmic surgery.  Risks, benefits, limitations, and alternatives regarding ophthalmic surgery have been reviewed with the patient.  Questions have been answered.  All parties agreeable.   Lockie Mola, MD  06/05/2023, 12:43 PM

## 2023-06-06 ENCOUNTER — Encounter: Payer: Self-pay | Admitting: Ophthalmology

## 2024-03-06 ENCOUNTER — Other Ambulatory Visit: Payer: Self-pay | Admitting: Neurology

## 2024-03-06 DIAGNOSIS — M545 Low back pain, unspecified: Secondary | ICD-10-CM

## 2024-03-06 DIAGNOSIS — R200 Anesthesia of skin: Secondary | ICD-10-CM

## 2024-03-12 ENCOUNTER — Ambulatory Visit
Admission: RE | Admit: 2024-03-12 | Discharge: 2024-03-12 | Disposition: A | Discharge status: Home / Self Care | Source: Ambulatory Visit | Attending: Neurology | Admitting: Neurology

## 2024-03-12 DIAGNOSIS — R202 Paresthesia of skin: Secondary | ICD-10-CM | POA: Insufficient documentation

## 2024-03-12 DIAGNOSIS — R200 Anesthesia of skin: Secondary | ICD-10-CM | POA: Insufficient documentation

## 2024-03-12 DIAGNOSIS — M79605 Pain in left leg: Secondary | ICD-10-CM | POA: Insufficient documentation

## 2024-03-12 DIAGNOSIS — M79604 Pain in right leg: Secondary | ICD-10-CM | POA: Diagnosis present

## 2024-03-12 DIAGNOSIS — M545 Low back pain, unspecified: Secondary | ICD-10-CM | POA: Insufficient documentation

## 2024-03-19 ENCOUNTER — Encounter: Payer: Self-pay | Admitting: *Deleted

## 2024-03-26 ENCOUNTER — Encounter: Payer: Self-pay | Admitting: *Deleted

## 2024-03-27 ENCOUNTER — Ambulatory Visit: Admitting: Anesthesiology

## 2024-03-27 ENCOUNTER — Encounter
Admission: RE | Disposition: A | Discharge status: Home / Self Care | Payer: Self-pay | Source: Home / Self Care | Attending: Gastroenterology

## 2024-03-27 ENCOUNTER — Ambulatory Visit
Admission: RE | Admit: 2024-03-27 | Discharge: 2024-03-27 | Disposition: A | Discharge status: Home / Self Care | Payer: Medicare Other | Attending: Gastroenterology | Admitting: Gastroenterology

## 2024-03-27 ENCOUNTER — Encounter: Payer: Self-pay | Admitting: *Deleted

## 2024-03-27 DIAGNOSIS — E785 Hyperlipidemia, unspecified: Secondary | ICD-10-CM | POA: Diagnosis not present

## 2024-03-27 DIAGNOSIS — D123 Benign neoplasm of transverse colon: Secondary | ICD-10-CM | POA: Insufficient documentation

## 2024-03-27 DIAGNOSIS — I10 Essential (primary) hypertension: Secondary | ICD-10-CM | POA: Diagnosis not present

## 2024-03-27 DIAGNOSIS — K219 Gastro-esophageal reflux disease without esophagitis: Secondary | ICD-10-CM | POA: Diagnosis not present

## 2024-03-27 DIAGNOSIS — K635 Polyp of colon: Secondary | ICD-10-CM | POA: Diagnosis not present

## 2024-03-27 DIAGNOSIS — Z87891 Personal history of nicotine dependence: Secondary | ICD-10-CM | POA: Diagnosis not present

## 2024-03-27 DIAGNOSIS — Z79899 Other long term (current) drug therapy: Secondary | ICD-10-CM | POA: Insufficient documentation

## 2024-03-27 DIAGNOSIS — D122 Benign neoplasm of ascending colon: Secondary | ICD-10-CM | POA: Diagnosis not present

## 2024-03-27 DIAGNOSIS — K640 First degree hemorrhoids: Secondary | ICD-10-CM | POA: Insufficient documentation

## 2024-03-27 DIAGNOSIS — G4733 Obstructive sleep apnea (adult) (pediatric): Secondary | ICD-10-CM | POA: Insufficient documentation

## 2024-03-27 DIAGNOSIS — Z09 Encounter for follow-up examination after completed treatment for conditions other than malignant neoplasm: Secondary | ICD-10-CM | POA: Diagnosis present

## 2024-03-27 HISTORY — PX: POLYPECTOMY: SHX149

## 2024-03-27 HISTORY — PX: COLONOSCOPY WITH PROPOFOL: SHX5780

## 2024-03-27 SURGERY — COLONOSCOPY WITH PROPOFOL
Anesthesia: General

## 2024-03-27 MED ORDER — LIDOCAINE HCL (CARDIAC) PF 100 MG/5ML IV SOSY
PREFILLED_SYRINGE | INTRAVENOUS | Status: DC | PRN
Start: 2024-03-27 — End: 2024-03-27
  Administered 2024-03-27: 100 mg via INTRAVENOUS

## 2024-03-27 MED ORDER — PROPOFOL 10 MG/ML IV BOLUS
INTRAVENOUS | Status: DC | PRN
  Administered 2024-03-27: 60 mg via INTRAVENOUS

## 2024-03-27 MED ORDER — SODIUM CHLORIDE 0.9 % IV SOLN
INTRAVENOUS | Status: DC
  Administered 2024-03-27: 1000 mL via INTRAVENOUS

## 2024-03-27 MED ORDER — GLYCOPYRROLATE 0.2 MG/ML IJ SOLN
INTRAMUSCULAR | Status: DC | PRN
  Administered 2024-03-27: .2 mg via INTRAVENOUS

## 2024-03-27 MED ORDER — PROPOFOL 500 MG/50ML IV EMUL
INTRAVENOUS | Status: DC | PRN
  Administered 2024-03-27: 165 ug/kg/min via INTRAVENOUS

## 2024-03-27 NOTE — Anesthesia Preprocedure Evaluation (Signed)
 Anesthesia Evaluation  Patient identified by MRN, date of birth, ID band Patient awake    Reviewed: Allergy & Precautions, NPO status , Patient's Chart, lab work & pertinent test results  History of Anesthesia Complications Negative for: history of anesthetic complications  Airway Mallampati: II  TM Distance: >3 FB Neck ROM: full    Dental  (+) Missing   Pulmonary sleep apnea and Continuous Positive Airway Pressure Ventilation , former smoker   Pulmonary exam normal        Cardiovascular hypertension, On Medications + dysrhythmias Atrial Fibrillation      Neuro/Psych negative neurological ROS  negative psych ROS   GI/Hepatic Neg liver ROS,GERD  Medicated,,  Endo/Other  negative endocrine ROS    Renal/GU negative Renal ROS  negative genitourinary   Musculoskeletal   Abdominal   Peds  Hematology negative hematology ROS (+)   Anesthesia Other Findings Past Medical History: No date: Enlarged prostate No date: GERD (gastroesophageal reflux disease) No date: History of anxiety No date: History of atrial fibrillation No date: Hypertension No date: Sleep apnea     Comment:  CPAP No date: Weakness of right side of body     Comment:  Arm and leg weakness "since surgery"  Past Surgical History: No date: BRAIN TUMOR EXCISION 06/05/2023: CATARACT EXTRACTION W/PHACO; Right     Comment:  Procedure: CATARACT EXTRACTION PHACO AND INTRAOCULAR               LENS PLACEMENT (IOC) RIGHT [place] DUAL BLADE GONIOTOMY               7.22 00:45.8;  Surgeon: Annell Kidney, MD;                Location: [facility];  Service: Ophthalmology;                Laterality: Right;  sleep apnea No date: CERVICAL DISCECTOMY No date: EYE SURGERY  BMI    Body Mass Index: 33.49 kg/m      Reproductive/Obstetrics negative OB ROS                             Anesthesia Physical Anesthesia Plan  ASA:  3  Anesthesia Plan: General   Post-op Pain Management: Minimal or no pain anticipated   Induction: Intravenous  PONV Risk Score and Plan: 1 and Propofol infusion and TIVA  Airway Management Planned: Natural Airway and Nasal Cannula  Additional Equipment:   Intra-op Plan:   Post-operative Plan:   Informed Consent: I have reviewed the patients History and Physical, chart, labs and discussed the procedure including the risks, benefits and alternatives for the proposed anesthesia with the patient or authorized representative who has indicated his/her understanding and acceptance.     Dental Advisory Given  Plan Discussed with: Anesthesiologist, CRNA and Surgeon  Anesthesia Plan Comments: (Patient consented for risks of anesthesia including but not limited to:  - adverse reactions to medications - risk of airway placement if required - damage to eyes, teeth, lips or other oral mucosa - nerve damage due to positioning  - sore throat or hoarseness - Damage to heart, brain, nerves, lungs, other parts of body or loss of life  Patient voiced understanding and assent.)       Anesthesia Quick Evaluation

## 2024-03-27 NOTE — Op Note (Signed)
 [facility] Patient Name: Damon Coffey Procedure Date: 03/27/2024 9:53 AM MRN: [id number] Account #: [id number] Date of Birth: 14-Apr-1951 Admit Type: Outpatient Age: 73 Room: [facility] ENDO ROOM 3 Gender: Male Note Status: Finalized Instrument Name: Charlyn Cooley [id number] Procedure:             Colonoscopy Indications:           Surveillance: Personal history of adenomatous polyps                         on last colonoscopy > 3 years ago Providers:             Leida Puna MD, MD Medicines:             Monitored Anesthesia Care Complications:         No immediate complications. Estimated blood loss:                         Minimal. Procedure:             Pre-Anesthesia Assessment:                        - Prior to the procedure, a History and Physical was                         performed, and patient medications and allergies were                         reviewed. The patient is competent. The risks and                         benefits of the procedure and the sedation options and                         risks were discussed with the patient. All questions                         were answered and informed consent was obtained.                         Patient identification and proposed procedure were                         verified by the physician, the nurse, the                         anesthesiologist, the anesthetist and the technician                         in the endoscopy suite. Mental Status Examination:                         alert and oriented. Airway Examination: normal                         oropharyngeal airway and neck mobility. Respiratory                         Examination: clear to auscultation. CV Examination:  normal. Prophylactic Antibiotics: The patient does not                         require prophylactic antibiotics. Prior                         Anticoagulants: The patient has taken no anticoagulant                          or antiplatelet agents. ASA Grade Assessment: III - A                         patient with severe systemic disease. After reviewing                         the risks and benefits, the patient was deemed in                         satisfactory condition to undergo the procedure. The                         anesthesia plan was to use monitored anesthesia care                         (MAC). Immediately prior to administration of                         medications, the patient was re-assessed for adequacy                         to receive sedatives. The heart rate, respiratory                         rate, oxygen saturations, blood pressure, adequacy of                         pulmonary ventilation, and response to care were                         monitored throughout the procedure. The physical                         status of the patient was re-assessed after the                         procedure.                        After obtaining informed consent, the colonoscope was                         passed under direct vision. Throughout the procedure,                         the patient's blood pressure, pulse, and oxygen                         saturations were monitored continuously. The  Colonoscope was introduced through the anus and                         advanced to the the cecum, identified by appendiceal                         orifice and ileocecal valve. The colonoscopy was                         performed without difficulty. The patient tolerated                         the procedure well. The quality of the bowel                         preparation was good. The ileocecal valve, appendiceal                         orifice, and rectum were photographed. Findings:      The perianal and digital rectal examinations were normal.      A 13 mm polyp was found in the ascending colon. The polyp was       semi-pedunculated. The polyp was  removed with a hot snare. Resection and       retrieval were complete. Estimated blood loss was minimal.      Two sessile polyps were found in the transverse colon. The polyps were 3       to 4 mm in size. These polyps were removed with a cold snare. Resection       and retrieval were complete. Estimated blood loss was minimal.      A 3 mm polyp was found in the sigmoid colon. The polyp was sessile. The       polyp was removed with a cold snare. Resection and retrieval were       complete. Estimated blood loss was minimal.      Internal hemorrhoids were found during retroflexion. The hemorrhoids       were Grade I (internal hemorrhoids that do not prolapse).      The exam was otherwise without abnormality on direct and retroflexion       views. Impression:            - One 13 mm polyp in the ascending colon, removed with                         a hot snare. Resected and retrieved.                        - Two 3 to 4 mm polyps in the transverse colon,                         removed with a cold snare. Resected and retrieved.                        - One 3 mm polyp in the sigmoid colon, removed with a                         cold snare. Resected and retrieved.                        -  Internal hemorrhoids.                        - The examination was otherwise normal on direct and                         retroflexion views. Recommendation:        - Discharge patient to home.                        - Resume previous diet.                        - Continue present medications.                        - Await pathology results.                        - Repeat colonoscopy in 3 years for surveillance.                        - Return to referring physician as previously                         scheduled. Procedure Code(s):     --- Professional ---                        [phone], Colonoscopy, flexible; with removal of                         tumor(s), polyp(s), or other lesion(s) by snare                          technique Diagnosis Code(s):     --- Professional ---                        Z86.010, Personal history of colonic polyps                        D12.2, Benign neoplasm of ascending colon                        D12.5, Benign neoplasm of sigmoid colon                        D12.3, Benign neoplasm of transverse colon (hepatic                         flexure or splenic flexure)                        K64.0, First degree hemorrhoids CPT copyright 2022 American Medical Association. All rights reserved. The codes documented in this report are preliminary and upon coder review may  be revised to meet current compliance requirements. Leida Puna MD, MD 03/27/2024 10:45:59 AM Number of Addenda: 0 Note Initiated On: 03/27/2024 9:53 AM Scope Withdrawal Time: 0 hours 16 minutes 37 seconds  Total Procedure Duration: 0 hours 24 minutes 0 seconds  Estimated Blood Loss:  Estimated blood loss was minimal.      [facility]

## 2024-03-27 NOTE — Anesthesia Procedure Notes (Signed)
 Procedure Name: General with mask airway Date/Time: 03/27/2024 10:06 AM  Performed by: Niki Barter, CRNAPre-anesthesia Checklist: Patient identified, Emergency Drugs available, Suction available and Patient being monitored Patient Re-evaluated:Patient Re-evaluated prior to induction Oxygen Delivery Method: Simple face mask Induction Type: IV induction Placement Confirmation: positive ETCO2 and breath sounds checked- equal and bilateral Dental Injury: Teeth and Oropharynx as per pre-operative assessment

## 2024-03-27 NOTE — Interval H&P Note (Signed)
 History and Physical Interval Note:  03/27/2024 10:41 AM  Damon Coffey  has presented today for surgery, with the diagnosis of PH Colon Polyps.  The various methods of treatment have been discussed with the patient and family. After consideration of risks, benefits and other options for treatment, the patient has consented to  Procedure(s): COLONOSCOPY WITH PROPOFOL (N/A) POLYPECTOMY, INTESTINE as a surgical intervention.  The patient's history has been reviewed, patient examined, no change in status, stable for surgery.  I have reviewed the patient's chart and labs.  Questions were answered to the patient's satisfaction.     Shane Darling  Ok to proceed with colonoscopy

## 2024-03-27 NOTE — Transfer of Care (Signed)
 Immediate Anesthesia Transfer of Care Note  Patient: Damon Coffey  Procedure(s) Performed: COLONOSCOPY WITH PROPOFOL POLYPECTOMY, INTESTINE  Patient Location: Endoscopy Unit  Anesthesia Type:General  Level of Consciousness: drowsy and patient cooperative  Airway & Oxygen Therapy: Patient Spontanous Breathing and Patient connected to face mask oxygen  Post-op Assessment: Report given to RN and Post -op Vital signs reviewed and stable  Post vital signs: Reviewed and stable  Last Vitals:  Vitals Value Taken Time  BP 97/61 03/27/24 1040  Temp 35.4 C 03/27/24 1038  Pulse 62 03/27/24 1041  Resp 13 03/27/24 1041  SpO2 100 % 03/27/24 1041  Vitals shown include unfiled device data.  Last Pain:  Vitals:   03/27/24 1038  TempSrc: Temporal  PainSc: Asleep         Complications: No notable events documented.

## 2024-03-27 NOTE — H&P (Signed)
 Outpatient short stay form Pre-procedure 03/27/2024  Damon Darling, MD  Primary Physician: Damon Gold, MD  Reason for visit:  Surveillance  History of present illness:    73 y/o gentleman with history of HLD, hypertension, and OSA here for surveillance colonoscopy. Last colonoscopy in 2021 with 3 small Ta's. No blood thinners. No family history of GI malignancies. No significant abdominal surgeries.    Current Facility-Administered Medications:    0.9 %  sodium chloride  infusion, , Intravenous, Continuous, Glori Machnik, Leanora Prophet, MD, Stopped at 03/27/24 1034  Facility-Administered Medications Ordered in Other Encounters:    glycopyrrolate (ROBINUL) injection, , Intravenous, Anesthesia Intra-op, Damon Coffey, Tawana, CRNA, 0.2 mg at 03/27/24 1012   lidocaine  (cardiac) 100 mg/76mL (XYLOCAINE ) injection 2%, , Intravenous, Anesthesia Intra-op, Damon Coffey, Tawana, CRNA, 100 mg at 03/27/24 1008   propofol (DIPRIVAN) 10 mg/mL bolus/IV push, , Intravenous, Anesthesia Intra-op, Damon Coffey, Tawana, CRNA, 60 mg at 03/27/24 1008   propofol (DIPRIVAN) 500 MG/50ML infusion, , Intravenous, Continuous PRN, Damon Coffey, Tawana, CRNA, Stopped at 03/27/24 1034  Medications Prior to Admission  Medication Sig Dispense Refill Last Dose/Taking   acetaminophen  (TYLENOL ) 500 MG tablet Take by mouth.   Past Week   atorvastatin (LIPITOR) 10 MG tablet Take 10 mg by mouth daily.   Past Week   brimonidine (ALPHAGAN) 0.2 % ophthalmic solution 1 drop 2 (two) times daily.   03/27/2024   Cholecalciferol (VITAMIN D-1000 MAX ST) 25 MCG (1000 UT) tablet Take by mouth.   Past Week   Digestive Enzymes (DIGESTIVE ENZYME PO) Take by mouth daily.   Past Week   dorzolamide-timolol (COSOPT) 2-0.5 % ophthalmic solution 1 drop 2 (two) times daily.   03/27/2024   erythromycin ophthalmic ointment APPLY A SMALL AMOUNT IN THE RIGHT EYE AS NEEDED   Past Week   fluticasone (FLONASE) 50 MCG/ACT nasal spray  Place into the nose daily as needed.   Past Week   hydrochlorothiazide (HYDRODIURIL) 25 MG tablet Take by mouth.   03/26/2024 Morning   methocarbamol  (ROBAXIN ) 500 MG tablet Take 1 tablet (500 mg total) by mouth 4 (four) times daily. (Patient taking differently: Take 500 mg by mouth 4 (four) times daily as needed.) 16 tablet 0 Past Week   neomycin-polymyxin b-dexamethasone (MAXITROL) 3.5-10000-0.1 SUSP 1 drop 3 (three) times daily.   Past Week   Probiotic Product (PROBIOTIC DAILY PO) Take by mouth daily.   Past Week   psyllium (METAMUCIL) 58.6 % powder Take 1 packet by mouth daily.   Past Month   sucralfate  (CARAFATE ) 1 g tablet Take 1 tablet (1 g total) 4 (four) times daily by mouth. (Patient taking differently: Take 1 g by mouth 4 (four) times daily as needed.) 60 tablet 0 Past Week   tamsulosin (FLOMAX) 0.4 MG CAPS capsule Take 1.6 mg by mouth daily.   Past Week   TURMERIC-GINGER PO Take by mouth daily.   Past Week   famotidine  (PEPCID ) 40 MG tablet Take 1 tablet (40 mg total) every evening by mouth. (Patient taking differently: Take 40 mg by mouth as needed.) 30 tablet 1    Vibegron  (GEMTESA ) 75 MG TABS Take 75 mg by mouth daily. (Patient not taking: Reported on 05/28/2023) 28 tablet 0      Allergies  Allergen Reactions   Iodinated Contrast Media Other (See Comments)    Flu like symptoms  " old type of xray dye" caused flu like symptoms       Past Medical History:  Diagnosis Date   Enlarged prostate  GERD (gastroesophageal reflux disease)    History of anxiety    History of atrial fibrillation    Hypertension    Sleep apnea    CPAP   Weakness of right side of body    Arm and leg weakness "since surgery"    Review of systems:  Otherwise negative.    Physical Exam  Gen: Alert, oriented. Appears stated age.  HEENT: PERRLA. Lungs: No respiratory distress CV: RRR Abd: soft, benign, no masses Ext: No edema    Planned procedures: Proceed with colonoscopy. The patient  understands the nature of the planned procedure, indications, risks, alternatives and potential complications including but not limited to bleeding, infection, perforation, damage to internal organs and possible oversedation/side effects from anesthesia. The patient agrees and gives consent to proceed.  Please refer to procedure notes for findings, recommendations and patient disposition/instructions.     Damon Darling, MD [facility]

## 2024-03-27 NOTE — OR Nursing (Signed)
 Pt spouse not brought back to post-op, she was not found in lobby, tried calling her phone number listed, went straight to VM

## 2024-03-28 NOTE — Anesthesia Postprocedure Evaluation (Signed)
 Anesthesia Post Note  Patient: Damon Coffey  Procedure(s) Performed: COLONOSCOPY WITH PROPOFOL POLYPECTOMY, INTESTINE  Patient location during evaluation: Endoscopy Anesthesia Type: General Level of consciousness: awake and alert Pain management: pain level controlled Vital Signs Assessment: post-procedure vital signs reviewed and stable Respiratory status: spontaneous breathing, nonlabored ventilation, respiratory function stable and patient connected to nasal cannula oxygen Cardiovascular status: blood pressure returned to baseline and stable Postop Assessment: no apparent nausea or vomiting Anesthetic complications: no   No notable events documented.   Last Vitals:  Vitals:   03/27/24 1053 03/27/24 1059  BP: 98/63 129/80  Pulse: 62 (!) 57  Resp: 16 15  Temp:    SpO2: 99% 100%    Last Pain:  Vitals:   03/27/24 1059  TempSrc:   PainSc: 0-No pain                 Nancey Awkward

## 2024-03-30 LAB — SURGICAL PATHOLOGY

## 2024-04-22 ENCOUNTER — Ambulatory Visit: Payer: Self-pay | Admitting: Urology

## 2024-06-19 DIAGNOSIS — D480 Neoplasm of uncertain behavior of bone and articular cartilage: Secondary | ICD-10-CM | POA: Insufficient documentation

## 2024-06-19 DIAGNOSIS — M5412 Radiculopathy, cervical region: Secondary | ICD-10-CM | POA: Insufficient documentation

## 2024-06-19 NOTE — Progress Notes (Signed)
 Referring Physician:  Epifanio Alm SQUIBB, MD [address] [place],  [place] [address]  Primary Physician:  Epifanio Alm SQUIBB, MD  History of Present Illness: 06/25/2024 Mr. Damon Coffey is here today with a chief complaint of neck surgery and brain tumor who presents with difficulty walking and balance issues.  He experiences difficulty walking, feeling as though he is moving through water with significant resistance and fatigue. Numbness radiates down his left leg. His balance is compromised, and he tends to bend forward when walking. Although he has not had recent falls, he feels unable to recover quickly if he trips.  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 He underwent surgery for a brain tumor in 2015, identified as a hemangioblastoma. Occasionally, he experiences headaches with pain upon pressing on his head.  Bowel/Bladder Dysfunction: none  Conservative measures:  Physical therapy:  has participated in with [facility]-completed  Multimodal medical therapy including regular antiinflammatories:  Tylenol , Robaxin  Injections:  no epidural steroid injections  Past Surgery:  Cervical spine discectomy 01/12/2013  C5-C6   Damon Coffey has some symptoms of cervical myelopathy.  The symptoms are causing a significant impact on the patient's life.   I have utilized the care everywhere function in epic to review the outside records available from external health systems.  Review of Systems:  A 10 point review of systems is negative, except for the pertinent positives and negatives detailed in the HPI.  Past Medical History: Past Medical History:  Diagnosis Date   Enlarged prostate    GERD (gastroesophageal reflux disease)    History of anxiety    History of atrial fibrillation    Hypertension    Sleep apnea    CPAP   Weakness of right side of body    Arm and leg weakness  since surgery    Past Surgical History: Past Surgical History:  Procedure Laterality Date   BRAIN TUMOR EXCISION     CATARACT EXTRACTION W/PHACO Right 06/05/2023   Procedure: CATARACT EXTRACTION PHACO AND INTRAOCULAR LENS PLACEMENT (IOC) RIGHT KAHOOK DUAL BLADE GONIOTOMY 7.22 00:45.8;  Surgeon: Mittie Gaskin, MD;  Location: [facility];  Service: Ophthalmology;  Laterality: Right;  sleep apnea   CERVICAL DISCECTOMY     COLONOSCOPY WITH PROPOFOL  N/A 03/27/2024   Procedure: COLONOSCOPY WITH PROPOFOL ;  Surgeon: Maryruth Ole DASEN, MD;  Location: [facility];  Service: Endoscopy;  Laterality: N/A;   EYE SURGERY     POLYPECTOMY  03/27/2024   Procedure: POLYPECTOMY, INTESTINE;  Surgeon: Maryruth Ole DASEN, MD;  Location: [facility] ENDOSCOPY;  Service: Endoscopy;;    Allergies: Allergies as of 06/25/2024 - Review Complete 03/27/2024  Allergen Reaction Noted   Iodinated contrast media Other (See Comments) 01/05/2014    Medications:  Current Outpatient Medications:    acetaminophen  (TYLENOL ) 500 MG tablet, Take by mouth., Disp: , Rfl:    atorvastatin (LIPITOR) 10 MG tablet, Take 10 mg by mouth daily., Disp: , Rfl:    brimonidine (ALPHAGAN) 0.2 % ophthalmic solution, 1 drop 2 (two) times daily., Disp: , Rfl:    Cholecalciferol (VITAMIN D-1000 MAX ST) 25 MCG (1000 UT) tablet, Take by mouth., Disp: , Rfl:    Digestive Enzymes (DIGESTIVE ENZYME PO), Take by mouth daily., Disp: , Rfl:    dorzolamide-timolol (COSOPT) 2-0.5 % ophthalmic solution, 1 drop 2 (two) times daily., Disp: , Rfl:    erythromycin ophthalmic ointment, APPLY A  SMALL AMOUNT IN THE RIGHT EYE AS NEEDED, Disp: , Rfl:    famotidine  (PEPCID ) 40 MG tablet, Take 1 tablet (40 mg total) every evening by mouth. (Patient taking differently: Take 40 mg by mouth as needed.), Disp: 30 tablet, Rfl: 1   fluticasone (FLONASE) 50 MCG/ACT nasal spray, Place into the nose daily as needed., Disp: , Rfl:    hydrochlorothiazide  (HYDRODIURIL) 25 MG tablet, Take by mouth., Disp: , Rfl:    methocarbamol  (ROBAXIN ) 500 MG tablet, Take 1 tablet (500 mg total) by mouth 4 (four) times daily. (Patient taking differently: Take 500 mg by mouth 4 (four) times daily as needed.), Disp: 16 tablet, Rfl: 0   neomycin-polymyxin b-dexamethasone (MAXITROL) 3.5-10000-0.1 SUSP, 1 drop 3 (three) times daily., Disp: , Rfl:    Probiotic Product (PROBIOTIC DAILY PO), Take by mouth daily., Disp: , Rfl:    psyllium (METAMUCIL) 58.6 % powder, Take 1 packet by mouth daily., Disp: , Rfl:    sucralfate  (CARAFATE ) 1 g tablet, Take 1 tablet (1 g total) 4 (four) times daily by mouth. (Patient taking differently: Take 1 g by mouth 4 (four) times daily as needed.), Disp: 60 tablet, Rfl: 0   tamsulosin (FLOMAX) 0.4 MG CAPS capsule, Take 1.6 mg by mouth daily., Disp: , Rfl:    TURMERIC-GINGER PO, Take by mouth daily., Disp: , Rfl:    Vibegron  (GEMTESA ) 75 MG TABS, Take 75 mg by mouth daily. (Patient not taking: Reported on 05/28/2023), Disp: 28 tablet, Rfl: 0  Social History: Social History   Tobacco Use   Smoking status: Former    Current packs/day: 0.00    Types: Cigarettes    Quit date: 1988    Years since quitting: 37.6    Passive exposure: Past   Smokeless tobacco: Never  Vaping Use   Vaping status: Never Used  Substance Use Topics   Alcohol use: Yes    Alcohol/week: 4.0 standard drinks of alcohol    Types: 4 Standard drinks or equivalent per week   Drug use: Never    Family Medical History: No family history on file.  Physical Examination: There were no vitals filed for this visit.  General: Patient is in no apparent distress. Attention to examination is appropriate.  Neck:   Supple.  Full range of motion.  Respiratory: Patient is breathing without any difficulty.   NEUROLOGICAL:     Awake, alert, oriented to person, place, and time.  Speech is clear and fluent.   Cranial Nerves: Pupils equal round and reactive to light.   Facial tone is symmetric.  Facial sensation is symmetric. Shoulder shrug is symmetric. Tongue protrusion is midline.  There is no pronator drift.  Strength: Side Biceps Triceps Deltoid Interossei Grip Wrist Ext. Wrist Flex.  R 5 5 5  4- 4- 5 5  L 5 5 5 5 5 5 5    Side Iliopsoas Quads Hamstring PF DF EHL  R 5 5 5 5 5 5   L 5 5 5 5 5 5    Reflexes are 1+ and symmetric at the biceps, triceps, brachioradialis, patella and achilles.   Hoffman's is absent.   Bilateral upper and lower extremity sensation is intact to light touch with exception of right ulnar nerve distribution decreased sensation.    No evidence of dysmetria noted.  Gait is abnormal and wide-based.  He has a claw hand on the right side indicative of ulnar nerve dysfunction.   Medical Decision Making  Imaging: MR L spine 03/12/2024 IMPRESSION: 1. At L3-4  there is a mild disc bulge. Severe bilateral facet arthropathy with ligamentum flavum thickening. Severe central canal stenosis. Mild bilateral foraminal stenosis. 2. At L4-5 there is a mild disc bulge. Severe bilateral facet arthropathy and ligamentum flavum thickening. Severe central canal stenosis. Mild bilateral foraminal stenosis. 3. At L5-S1 there is a mild disc bulge with a broad central disc protrusion contacting the intraspinal S1 nerve roots. Mild bilateral facet arthropathy. Mild left foraminal stenosis. 4. No acute osseous injury of the lumbar spine.     Electronically Signed   By: Julaine Blanch M.D.   On: 03/20/2024 12:01    I have personally reviewed the images and agree with the above interpretation.  Assessment and Plan: Mr. Tenbrink is a pleasant 73 y.o. male with lumbar stenosis causing neurogenic claudication.  I recommended L3-5 decompression.  He has tried and failed conservative management.  I discussed the planned procedure at length with the patient, including the risks, benefits, alternatives, and indications. The risks discussed include but  are not limited to bleeding, infection, need for reoperation, spinal fluid leak, stroke, vision loss, anesthetic complication, coma, paralysis, and even death. I also described in detail that improvement was not guaranteed.  The patient expressed understanding of these risks, and asked that we proceed with surgery. I described the surgery in layman's terms, and gave ample opportunity for questions, which were answered to the best of my ability.  He has some symptoms of myelopathy and some right upper extremity weakness with history of cervical stenosis and severe degenerative disc disease at C4-5.  I would like to get a cervical spine MRI scan before proceeding.  I spent a total of 30 minutes in this patient's care today. This time was spent reviewing pertinent records including imaging studies, obtaining and confirming history, performing a directed evaluation, formulating and discussing my recommendations, and documenting the visit within the medical record.       Thank you for involving me in the care of this patient.      Andrina Locken K. Clois MD, [facility]

## 2024-06-25 ENCOUNTER — Encounter: Payer: Self-pay | Admitting: Neurosurgery

## 2024-06-25 ENCOUNTER — Ambulatory Visit (INDEPENDENT_AMBULATORY_CARE_PROVIDER_SITE_OTHER): Admitting: Neurosurgery

## 2024-06-25 VITALS — BP 116/68 | Ht 67.5 in | Wt 215.0 lb

## 2024-06-25 DIAGNOSIS — G959 Disease of spinal cord, unspecified: Secondary | ICD-10-CM

## 2024-06-25 DIAGNOSIS — M48062 Spinal stenosis, lumbar region with neurogenic claudication: Secondary | ICD-10-CM

## 2024-06-25 DIAGNOSIS — Z981 Arthrodesis status: Secondary | ICD-10-CM | POA: Diagnosis not present

## 2024-06-25 DIAGNOSIS — M50321 Other cervical disc degeneration at C4-C5 level: Secondary | ICD-10-CM | POA: Diagnosis not present

## 2024-06-25 NOTE — Patient Instructions (Addendum)
 MRI cervical spine: [facility] scheduling will call you to schedule this. Their # is [phone] if you need to reschedule.   Please see below for information in regards to your upcoming surgery:   Planned surgery: L3-5 posterior spinal decompression   Surgery date: 09/07/24 at [facility]: [address], [place], [place] [address]) - you will find out your arrival time the business day before your surgery.   Pre-op appointment at [facility] Pre-admit Testing: you will receive a call with a date/time for this appointment. If you are scheduled for an in person appointment, Pre-admit Testing is located on the first floor of the [facility] building, [address], [address]. During this appointment, they will advise you which medications you can take the morning of surgery, and which medications you will need to hold for surgery. Labs (such as blood work, EKG) may be done at your pre-op appointment. You are not required to fast for these labs. Should you need to change your pre-op appointment, please call Pre-admit testing at [phone].     Surgical clearance: we will send a clearance form to Dr Epifanio. They may wish to see you in their office prior to signing the clearance form. If so, they may call you to schedule an appointment.     Common restrictions after spine surgery: No bending, lifting, or twisting ("BLT"). Avoid lifting objects heavier than 10 pounds for the first 6 weeks after surgery. Where possible, avoid household activities that involve lifting, bending, reaching, pushing, or pulling such as laundry, vacuuming, grocery shopping, and childcare. Try to arrange for help from friends and family for these activities while you heal. Do not drive while taking prescription pain medication. Weeks 6 through 12 after surgery: avoid lifting more than 25 pounds.     How to contact us :  If you have any questions/concerns  before or after surgery, you can reach us  at [phone], or you can send a mychart message. We can be reached by phone or mychart 8am-4pm, Monday-Friday.  *Please note: Calls after 4pm are forwarded to a third party answering service. Mychart messages are not routinely monitored during evenings, weekends, and holidays. Please call our office to contact the answering service for urgent concerns during non-business hours.    If you have FMLA/disability paperwork, please drop it off or fax it to [phone]   Appointments/FMLA & disability paperwork: [employer] Registered Nurse/Surgery scheduler: Bryannah Boston, RN Certified Medical Assistants: Don, CMA, Elenor, CMA, & Damien, CMA Physician Assistants: Lyle Decamp, PA-C, Edsel Goods, PA-C & Glade Boys, PA-C Surgeons: Penne Sharps, MD & Reeves Daisy, MD   [facility] PREADMIT TESTING VISIT and SURGERY INFORMATION SHEET   Now that surgery has been scheduled you can anticipate several phone calls from [facility] services. A pharmacy technician will call you to verify your current list of medications taken at home.               The Pre-Service Center will call to verify your insurance information and to give you billing estimates and information.             The Preadmit Testing Office will be calling to schedule a visit to obtain information for the anesthesia team and provide instructions on preparation for surgery.  What can you expect for the Preadmit Testing Visit: Appointments may be scheduled in-person or by telephone.  If a telephone visit is scheduled, you may be asked to come  into the office to have lab tests or other studies performed.   This visit will not be completed any greater than 14 days prior to your surgery.  If your surgery has been scheduled for a future date, please do not be alarmed if we have not contacted you to schedule an appointment more than a month prior to the surgery date.     Please be prepared to provide the following information during this appointment:            -Personal medical history                                               -Medication and allergy list            -Any history of problems with anesthesia              -Recent lab work or diagnostic studies            -Please notify us  of any needs we should be aware of to provide the best care possible           -You will be provided with instructions on how to prepare for your surgery.    On The Day of Surgery:  You must have a driver to take you home after surgery, you will be asked not to drive for 24 hours following surgery.  Taxi, Gisele and non-medical transport will not be acceptable means of transportation unless you have a responsible individual who will be traveling with you.  Visitors in the surgical area:   2 people will be able to visit you in your room once your preparation for surgery has been completed. During surgery, your visitors will be asked to wait in the Surgery Waiting Area.  It is not a requirement for them to stay, if they prefer to leave and come back.  Your visitor(s) will be given an update once the surgery has been completed.  No visitors are allowed in the initial recovery room to respect patient privacy and safety.  Once you are more awake and transfer to the secondary recovery area, or are transferred to an inpatient room, visitors will again be able to see you.  To respect and protect your privacy: We will ask on the day of surgery who your driver will be and what the contact number for that individual will be. We will ask if it is okay to share information with this individual, or if there is an alternative individual that we, or the surgeon, should contact to provide updates and information. If family or friends come to the surgical information desk requesting information about you, who you have not listed with us , no information will be given.   It may be helpful to  designate someone as the main contact who will be responsible for updating your other friends and family.    PREADMIT TESTING OFFICE: [phone] SAME DAY SURGERY: [phone] We look forward to caring for you before and throughout the process of your surgery.

## 2024-06-26 ENCOUNTER — Other Ambulatory Visit: Payer: Self-pay

## 2024-06-26 DIAGNOSIS — Z01818 Encounter for other preprocedural examination: Secondary | ICD-10-CM

## 2024-07-02 ENCOUNTER — Ambulatory Visit
Admission: RE | Admit: 2024-07-02 | Discharge: 2024-07-02 | Disposition: A | Discharge status: Home / Self Care | Source: Ambulatory Visit | Attending: Neurosurgery | Admitting: Neurosurgery

## 2024-07-02 DIAGNOSIS — M50321 Other cervical disc degeneration at C4-C5 level: Secondary | ICD-10-CM | POA: Insufficient documentation

## 2024-07-02 DIAGNOSIS — G959 Disease of spinal cord, unspecified: Secondary | ICD-10-CM | POA: Diagnosis present

## 2024-07-02 DIAGNOSIS — Z981 Arthrodesis status: Secondary | ICD-10-CM | POA: Insufficient documentation

## 2024-07-03 ENCOUNTER — Telehealth: Payer: Self-pay

## 2024-07-03 NOTE — Telephone Encounter (Signed)
 Received signed surgery clearance form from Dr Epifanio with a note stating needs clearance from cardiology as well.  I have faxed a clearance form to Dr Florencio.

## 2024-07-16 ENCOUNTER — Encounter: Payer: Self-pay | Admitting: Neurosurgery

## 2024-07-20 ENCOUNTER — Ambulatory Visit (INDEPENDENT_AMBULATORY_CARE_PROVIDER_SITE_OTHER): Admitting: Physician Assistant

## 2024-07-20 VITALS — BP 121/72 | HR 84 | Ht 67.0 in | Wt 211.0 lb

## 2024-07-20 DIAGNOSIS — R350 Frequency of micturition: Secondary | ICD-10-CM

## 2024-07-20 DIAGNOSIS — N401 Enlarged prostate with lower urinary tract symptoms: Secondary | ICD-10-CM | POA: Diagnosis not present

## 2024-07-20 LAB — URINALYSIS, COMPLETE
Bilirubin, UA: NEGATIVE
Glucose, UA: NEGATIVE
Ketones, UA: NEGATIVE
Nitrite, UA: NEGATIVE
Protein,UA: NEGATIVE
RBC, UA: NEGATIVE
Specific Gravity, UA: <1.005 — ABNORMAL LOW (ref 1.005–1.030)
Urobilinogen, Ur: 0.2 mg/dL (ref 0.2–1.0)
pH, UA: 6 (ref 5.0–7.5)

## 2024-07-20 LAB — MICROSCOPIC EXAMINATION: RBC, Urine: NONE SEEN /HPF (ref 0–2)

## 2024-07-20 LAB — BLADDER SCAN AMB NON-IMAGING

## 2024-07-20 NOTE — Progress Notes (Signed)
 07/20/2024 1:39 PM   Damon Coffey September 20, 1951 [id number]  CC: Chief Complaint  Patient presents with   Establish Care   Urinary Frequency   HPI: Damon Coffey is a 73 y.o. male with PMH BPH with LUTS on Flomax and Gemtesa  and OSA who presents today for evaluation of urinary frequency.   Today he reports he remains on Flomax 0.8 mg daily, but stopped Gemtesa  4 to 5 months ago.  He describes bothersome nocturia x 4+ as well as straining, sensations of incomplete emptying, and intermittency that are worse overnight.  He will occasionally experience some suprapubic burning as well.  He was previously on CPAP, but Dr. Epifanio stopped this because he was losing weight and felt it was no longer necessary.  He reports gynecomastia that is bothersome to him.  In-office UA today positive for trace leukocytes; urine microscopy with 6-10 WBCs/HPF. PVR 25mL.  PMH: Past Medical History:  Diagnosis Date   Enlarged prostate    GERD (gastroesophageal reflux disease)    History of anxiety    History of atrial fibrillation    Hypertension    Sleep apnea    CPAP   Weakness of right side of body    Arm and leg weakness since surgery    Surgical History: Past Surgical History:  Procedure Laterality Date   BRAIN TUMOR EXCISION     CATARACT EXTRACTION W/PHACO Right 06/05/2023   Procedure: CATARACT EXTRACTION PHACO AND INTRAOCULAR LENS PLACEMENT (IOC) RIGHT KAHOOK DUAL BLADE GONIOTOMY 7.22 00:45.8;  Surgeon: Mittie Gaskin, MD;  Location: [facility];  Service: Ophthalmology;  Laterality: Right;  sleep apnea   CERVICAL DISCECTOMY     COLONOSCOPY WITH PROPOFOL  N/A 03/27/2024   Procedure: COLONOSCOPY WITH PROPOFOL ;  Surgeon: Maryruth Ole DASEN, MD;  Location: [facility];  Service: Endoscopy;  Laterality: N/A;   EYE SURGERY     POLYPECTOMY  03/27/2024   Procedure: POLYPECTOMY, INTESTINE;  Surgeon: Maryruth Ole DASEN, MD;  Location: [facility];  Service: Endoscopy;;     Home Medications:  Allergies as of 07/20/2024       Reactions   Iodinated Contrast Media Other (See Comments)   Flu like symptoms  old type of xray dye caused flu like symptoms         Medication List        Accurate as of July 20, 2024  1:39 PM. If you have any questions, ask your nurse or doctor.          acetaminophen  500 MG tablet Commonly known as: TYLENOL  Take by mouth.   atorvastatin 10 MG tablet Commonly known as: LIPITOR Take 10 mg by mouth daily.   brimonidine 0.2 % ophthalmic solution Commonly known as: ALPHAGAN 1 drop 2 (two) times daily.   DIGESTIVE ENZYME PO Take by mouth daily.   dorzolamide-timolol 2-0.5 % ophthalmic solution Commonly known as: COSOPT 1 drop 2 (two) times daily.   erythromycin ophthalmic ointment APPLY A SMALL AMOUNT IN THE RIGHT EYE AS NEEDED   famotidine  40 MG tablet Commonly known as: PEPCID  Take 1 tablet (40 mg total) every evening by mouth. What changed:  when to take this reasons to take this   fluticasone 50 MCG/ACT nasal spray Commonly known as: FLONASE Place into the nose daily as needed.   Gemtesa  75 MG Tabs Generic drug: Vibegron  Take 75 mg by mouth daily.   hydrochlorothiazide 25 MG tablet Commonly known as: HYDRODIURIL Take by mouth.   methocarbamol  500 MG tablet Commonly known  as: ROBAXIN  Take 1 tablet (500 mg total) by mouth 4 (four) times daily. What changed:  when to take this reasons to take this   Na Sulfate-K Sulfate-Mg Sulfate concentrate 17.5-3.13-1.6 GM/177ML Soln Commonly known as: SUPREP Take 1 Bottle by mouth.   neomycin-polymyxin b-dexamethasone 3.5-10000-0.1 Susp Commonly known as: MAXITROL 1 drop 3 (three) times daily.   PROBIOTIC DAILY PO Take by mouth daily.   psyllium 58.6 % powder Commonly known as: METAMUCIL Take 1 packet by mouth daily.   sucralfate  1 g tablet Commonly known as: Carafate  Take 1 tablet (1 g total) 4 (four) times daily by mouth. What  changed:  when to take this reasons to take this   tamsulosin 0.4 MG Caps capsule Commonly known as: FLOMAX Take 1.6 mg by mouth daily.   TURMERIC-GINGER PO Take by mouth daily.   Vitamin D-1000 Max St 25 MCG (1000 UT) tablet Generic drug: Cholecalciferol Take by mouth.        Allergies:  Allergies  Allergen Reactions   Iodinated Contrast Media Other (See Comments)    Flu like symptoms   old type of xray dye caused flu like symptoms      Family History: No family history on file.  Social History:   reports that he quit smoking about 37 years ago. His smoking use included cigarettes. He has been exposed to tobacco smoke. He has never used smokeless tobacco. He reports current alcohol use of about 4.0 standard drinks of alcohol per week. He reports that he does not use drugs.  Physical Exam: BP 121/72 (BP Location: Left Arm, Patient Position: Sitting, Cuff Size: Normal)   Pulse 84   Ht 5' 7 (1.702 m)   Wt 211 lb (95.7 kg)   SpO2 97%   BMI 33.05 kg/m   Constitutional:  Alert and oriented, no acute distress, nontoxic appearing HEENT: [place], AT Cardiovascular: No clubbing, cyanosis, or edema Respiratory: Normal respiratory effort, no increased work of breathing Skin: No rashes, bruises or suspicious lesions Neurologic: Grossly intact, no focal deficits, moving all 4 extremities Psychiatric: Normal mood and affect  Laboratory Data: Results for orders placed or performed in visit on 07/20/24  Microscopic Examination   Collection Time: 07/20/24  1:26 PM   Urine  Result Value Ref Range   WBC, UA 6-10 (A) 0 - 5 /hpf   RBC, Urine None seen 0 - 2 /hpf   Epithelial Cells (non renal) 0-10 0 - 10 /hpf   Bacteria, UA Few None seen/Few  Urinalysis, Complete   Collection Time: 07/20/24  1:26 PM  Result Value Ref Range   Specific Gravity, UA <1.005 (L) 1.005 - 1.030   pH, UA 6.0 5.0 - 7.5   Color, UA Yellow Yellow   Appearance Ur Clear Clear   Leukocytes,UA Trace (A)  Negative   Protein,UA Negative Negative/Trace   Glucose, UA Negative Negative   Ketones, UA Negative Negative   RBC, UA Negative Negative   Bilirubin, UA Negative Negative   Urobilinogen, Ur 0.2 0.2 - 1.0 mg/dL   Nitrite, UA Negative Negative   Microscopic Examination See below:   Bladder Scan (Post Void Residual) in office   Collection Time: 07/20/24  1:37 PM  Result Value Ref Range   Scan Result 25ml    Assessment & Plan:   1. Benign prostatic hyperplasia with urinary frequency (Primary) He continues to report bothersome obstructive voiding symptoms that are worse overnight as well as significant nocturia.  I do think there could be some  residual OSA causing this.  We discussed various options including augmenting Flomax with finasteride versus cystoscopy for consideration of outlet procedures.  He declines finasteride due to baseline gynecomastia.  He is open to cystoscopy, but would like to defer this until after he completes lumbar laminectomy with Dr. Clois in October.  He will call us  when he wishes to schedule.  We discussed switching his Flomax to p.m. dosing to see if it gives him better relief overnight. - Urinalysis, Complete - Bladder Scan (Post Void Residual) in office   Return for Call to schedule cystoscopy when ready.  Lucie Hones, PA-C  [facility] [place] [address], [address] [place], [place] [address] [phone]

## 2024-07-20 NOTE — Patient Instructions (Signed)

## 2024-07-31 ENCOUNTER — Telehealth: Payer: Self-pay | Admitting: Neurosurgery

## 2024-07-31 NOTE — Telephone Encounter (Signed)
 I have notified the OR to cancel his surgery. Surgery paperwork has been placed in bottom drawer.

## 2024-07-31 NOTE — Telephone Encounter (Signed)
 Patient called our office to cancel his surgery at this time. He states that something came up and he will need to call back once he has a date to reschedule. He has notified preadmission of this information. Post-op appointments have also been cancelled.

## 2024-08-28 ENCOUNTER — Other Ambulatory Visit

## 2024-09-01 ENCOUNTER — Ambulatory Visit (INDEPENDENT_AMBULATORY_CARE_PROVIDER_SITE_OTHER): Admitting: Neurosurgery

## 2024-09-01 VITALS — BP 126/82 | Ht 67.0 in | Wt 217.0 lb

## 2024-09-01 DIAGNOSIS — M48062 Spinal stenosis, lumbar region with neurogenic claudication: Secondary | ICD-10-CM | POA: Diagnosis not present

## 2024-09-01 NOTE — Progress Notes (Signed)
 Referring Physician:  Epifanio Alm SQUIBB, MD [address] [place],  [place] [address]  Primary Physician:  Epifanio Alm SQUIBB, MD  History of Present Illness: 09/01/2024 Mr. Geffre returns today to discuss his symptoms and imaging.  06/25/2024 Mr. Erhardt Dada is here today with a chief complaint of neck surgery and brain tumor who presents with difficulty walking and balance issues.  He experiences difficulty walking, feeling as though he is moving through water with significant resistance and fatigue. Numbness radiates down his left leg. His balance is compromised, and he tends to bend forward when walking. Although he has not had recent falls, he feels unable to recover quickly if he trips.  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 He underwent surgery for a brain tumor in 2015, identified as a hemangioblastoma. Occasionally, he experiences headaches with pain upon pressing on his head.  Bowel/Bladder Dysfunction: none  Conservative measures:  Physical therapy:  has participated in with [facility]-completed  Multimodal medical therapy including regular antiinflammatories:  Tylenol , Robaxin  Injections:  no epidural steroid injections  Past Surgery:  Cervical spine discectomy 01/12/2013  C5-C6   Ubaldo JONETTA Dewey has some symptoms of cervical myelopathy.  The symptoms are causing a significant impact on the patient's life.   I have utilized the care everywhere function in epic to review the outside records available from external health systems.  Review of Systems:  A 10 point review of systems is negative, except for the pertinent positives and negatives detailed in the HPI.  Past Medical History: Past Medical History:  Diagnosis Date   Enlarged prostate    GERD (gastroesophageal reflux disease)    History of anxiety    History of atrial fibrillation    Hypertension    Sleep  apnea    CPAP   Weakness of right side of body    Arm and leg weakness since surgery    Past Surgical History: Past Surgical History:  Procedure Laterality Date   BRAIN TUMOR EXCISION     CATARACT EXTRACTION W/PHACO Right 06/05/2023   Procedure: CATARACT EXTRACTION PHACO AND INTRAOCULAR LENS PLACEMENT (IOC) RIGHT KAHOOK DUAL BLADE GONIOTOMY 7.22 00:45.8;  Surgeon: Mittie Gaskin, MD;  Location: [facility];  Service: Ophthalmology;  Laterality: Right;  sleep apnea   CERVICAL DISCECTOMY     COLONOSCOPY WITH PROPOFOL  N/A 03/27/2024   Procedure: COLONOSCOPY WITH PROPOFOL ;  Surgeon: Maryruth Ole DASEN, MD;  Location: [facility];  Service: Endoscopy;  Laterality: N/A;   EYE SURGERY     POLYPECTOMY  03/27/2024   Procedure: POLYPECTOMY, INTESTINE;  Surgeon: Maryruth Ole DASEN, MD;  Location: [facility];  Service: Endoscopy;;    Allergies: Allergies as of 09/01/2024 - Review Complete 09/01/2024  Allergen Reaction Noted   Iodinated contrast media Other (See Comments) 01/05/2014    Medications:  Current Outpatient Medications:    acetaminophen  (TYLENOL ) 500 MG tablet, Take by mouth., Disp: , Rfl:    atorvastatin (LIPITOR) 10 MG tablet, Take 10 mg by mouth daily., Disp: , Rfl:    brimonidine (ALPHAGAN) 0.2 % ophthalmic solution, 1 drop 2 (two) times daily., Disp: , Rfl:    Cholecalciferol (VITAMIN D-1000 MAX ST) 25 MCG (1000 UT) tablet, Take by mouth., Disp: , Rfl:    Digestive Enzymes (DIGESTIVE ENZYME PO), Take by mouth daily., Disp: , Rfl:    dorzolamide-timolol (COSOPT) 2-0.5 % ophthalmic solution, 1 drop 2 (two) times  daily., Disp: , Rfl:    erythromycin ophthalmic ointment, APPLY A SMALL AMOUNT IN THE RIGHT EYE AS NEEDED, Disp: , Rfl:    famotidine  (PEPCID ) 40 MG tablet, Take 1 tablet (40 mg total) every evening by mouth. (Patient taking differently: Take 40 mg by mouth as needed.), Disp: 30 tablet, Rfl: 1   fluticasone (FLONASE) 50 MCG/ACT nasal spray, Place  into the nose daily as needed., Disp: , Rfl:    hydrochlorothiazide (HYDRODIURIL) 25 MG tablet, Take by mouth., Disp: , Rfl:    methocarbamol  (ROBAXIN ) 500 MG tablet, Take 1 tablet (500 mg total) by mouth 4 (four) times daily. (Patient taking differently: Take 500 mg by mouth 4 (four) times daily as needed.), Disp: 16 tablet, Rfl: 0   Na Sulfate-K Sulfate-Mg Sulfate concentrate (SUPREP) 17.5-3.13-1.6 GM/177ML SOLN, Take 1 Bottle by mouth., Disp: , Rfl:    neomycin-polymyxin b-dexamethasone (MAXITROL) 3.5-10000-0.1 SUSP, 1 drop 3 (three) times daily., Disp: , Rfl:    Probiotic Product (PROBIOTIC DAILY PO), Take by mouth daily., Disp: , Rfl:    psyllium (METAMUCIL) 58.6 % powder, Take 1 packet by mouth daily., Disp: , Rfl:    sucralfate  (CARAFATE ) 1 g tablet, Take 1 tablet (1 g total) 4 (four) times daily by mouth. (Patient taking differently: Take 1 g by mouth 4 (four) times daily as needed.), Disp: 60 tablet, Rfl: 0   tamsulosin (FLOMAX) 0.4 MG CAPS capsule, Take 1.6 mg by mouth daily., Disp: , Rfl:    TURMERIC-GINGER PO, Take by mouth daily., Disp: , Rfl:    Vibegron  (GEMTESA ) 75 MG TABS, Take 75 mg by mouth daily., Disp: 28 tablet, Rfl: 0  Social History: Social History   Tobacco Use   Smoking status: Former    Current packs/day: 0.00    Types: Cigarettes    Quit date: 1988    Years since quitting: 37.7    Passive exposure: Past   Smokeless tobacco: Never  Vaping Use   Vaping status: Never Used  Substance Use Topics   Alcohol use: Yes    Alcohol/week: 4.0 standard drinks of alcohol    Types: 4 Standard drinks or equivalent per week   Drug use: Never    Family Medical History: No family history on file.  Physical Examination: Vitals:   09/01/24 1054  BP: 126/82    General: Patient is in no apparent distress. Attention to examination is appropriate.  Neck:   Supple.  Full range of motion.  Respiratory: Patient is breathing without any difficulty.   NEUROLOGICAL:      Awake, alert, oriented to person, place, and time.  Speech is clear and fluent.   Cranial Nerves:  Intact  Motor strength is full other than his right hand where he has ulnar nerve dysfunction.   Gait is abnormal and wide-based.  He has a claw hand on the right side indicative of ulnar nerve dysfunction.   Medical Decision Making  Imaging: MR L spine 03/12/2024 IMPRESSION: 1. At L3-4 there is a mild disc bulge. Severe bilateral facet arthropathy with ligamentum flavum thickening. Severe central canal stenosis. Mild bilateral foraminal stenosis. 2. At L4-5 there is a mild disc bulge. Severe bilateral facet arthropathy and ligamentum flavum thickening. Severe central canal stenosis. Mild bilateral foraminal stenosis. 3. At L5-S1 there is a mild disc bulge with a broad central disc protrusion contacting the intraspinal S1 nerve roots. Mild bilateral facet arthropathy. Mild left foraminal stenosis. 4. No acute osseous injury of the lumbar spine.  Electronically Signed   By: Julaine Blanch M.D.   On: 03/20/2024 12:01  C spine MRI 07/02/2024  IMPRESSION: 1. Status post C5-7 ACDF. Residual uncovertebral spurring at both levels causes moderate to moderately severe foraminal narrowing at C6-7 and mild foraminal narrowing at C5-6. 2. Spondylosis at C3-4 causes moderate central canal stenosis with flattening of the ventral cord and severe bilateral foraminal narrowing. 3. Mild to moderate spinal stenosis at C4-5 with effacement of the ventral thecal sac. Moderate to moderately severe foraminal narrowing at C4-5 is worse on the right. 4. Moderately severe to severe bilateral foraminal narrowing at C7-T1 and T1-2.     Electronically Signed   By: Debby Prader M.D.   On: 07/12/2024 09:57  I have personally reviewed the images and agree with the above interpretation.  Assessment and Plan: Mr. Shells is a pleasant 73 y.o. male with lumbar stenosis causing neurogenic  claudication.  He has some cervical stenosis without significant symptoms currently.  He has met indications for L3-5 decompression.  He has some family issues ongoing.  He would like to resolve those prior to moving forward.   I spent a total of 10 minutes in this patient's care today. This time was spent reviewing pertinent records including imaging studies, obtaining and confirming history, performing a directed evaluation, formulating and discussing my recommendations, and documenting the visit within the medical record.       Thank you for involving me in the care of this patient.      Minta Fair K. Clois MD, [facility]

## 2024-09-07 ENCOUNTER — Ambulatory Visit: Admit: 2024-09-07 | Admitting: Neurosurgery

## 2024-09-07 SURGERY — LUMBAR LAMINECTOMY/DECOMPRESSION MICRODISCECTOMY 2 LEVELS
Anesthesia: General

## 2024-09-23 ENCOUNTER — Encounter: Admitting: Orthopedic Surgery

## 2024-10-20 ENCOUNTER — Encounter: Admitting: Neurosurgery

## 2024-12-02 ENCOUNTER — Encounter: Admitting: Orthopedic Surgery
